# Patient Record
Sex: Male | Born: 1970 | Race: Black or African American | Hispanic: No | Marital: Single | Smoking: Current every day smoker
Health system: Southern US, Community
[De-identification: ages and names within clinical notes are randomized; demographics above are authoritative.]

## PROBLEM LIST (undated history)

## (undated) DIAGNOSIS — K219 Gastro-esophageal reflux disease without esophagitis: Secondary | ICD-10-CM

## (undated) DIAGNOSIS — T7840XA Allergy, unspecified, initial encounter: Secondary | ICD-10-CM

## (undated) DIAGNOSIS — I10 Essential (primary) hypertension: Secondary | ICD-10-CM

## (undated) HISTORY — DX: Allergy, unspecified, initial encounter: T78.40XA

## (undated) HISTORY — PX: WISDOM TOOTH EXTRACTION: SHX21

## (undated) HISTORY — PX: EYE SURGERY: SHX253

---

## 2005-10-22 ENCOUNTER — Ambulatory Visit (HOSPITAL_COMMUNITY): Admission: RE | Admit: 2005-10-22 | Discharge: 2005-10-22 | Payer: Self-pay | Admitting: Chiropractic Medicine

## 2012-09-21 DIAGNOSIS — I10 Essential (primary) hypertension: Secondary | ICD-10-CM | POA: Insufficient documentation

## 2012-10-02 ENCOUNTER — Emergency Department (HOSPITAL_BASED_OUTPATIENT_CLINIC_OR_DEPARTMENT_OTHER): Payer: Self-pay

## 2012-10-02 ENCOUNTER — Encounter (HOSPITAL_BASED_OUTPATIENT_CLINIC_OR_DEPARTMENT_OTHER): Payer: Self-pay | Admitting: Family Medicine

## 2012-10-02 ENCOUNTER — Emergency Department (HOSPITAL_BASED_OUTPATIENT_CLINIC_OR_DEPARTMENT_OTHER)
Admission: EM | Admit: 2012-10-02 | Discharge: 2012-10-02 | Disposition: A | Discharge status: Home / Self Care | Payer: Self-pay | Attending: Emergency Medicine | Admitting: Emergency Medicine

## 2012-10-02 DIAGNOSIS — F17200 Nicotine dependence, unspecified, uncomplicated: Secondary | ICD-10-CM | POA: Insufficient documentation

## 2012-10-02 DIAGNOSIS — R5381 Other malaise: Secondary | ICD-10-CM | POA: Insufficient documentation

## 2012-10-02 DIAGNOSIS — R42 Dizziness and giddiness: Secondary | ICD-10-CM | POA: Insufficient documentation

## 2012-10-02 DIAGNOSIS — Z79899 Other long term (current) drug therapy: Secondary | ICD-10-CM | POA: Insufficient documentation

## 2012-10-02 DIAGNOSIS — R002 Palpitations: Secondary | ICD-10-CM | POA: Insufficient documentation

## 2012-10-02 DIAGNOSIS — I10 Essential (primary) hypertension: Secondary | ICD-10-CM | POA: Insufficient documentation

## 2012-10-02 HISTORY — DX: Essential (primary) hypertension: I10

## 2012-10-02 LAB — CBC WITH DIFFERENTIAL/PLATELET
Basophils Absolute: 0 10*3/uL (ref 0.0–0.1)
Basophils Relative: 0 % (ref 0–1)
Eosinophils Absolute: 0.1 10*3/uL (ref 0.0–0.7)
Eosinophils Relative: 2 % (ref 0–5)
HCT: 42.5 % (ref 39.0–52.0)
Hemoglobin: 14.5 g/dL (ref 13.0–17.0)
Lymphocytes Relative: 16 % (ref 12–46)
Lymphs Abs: 1.1 10*3/uL (ref 0.7–4.0)
MCH: 28.8 pg (ref 26.0–34.0)
MCHC: 34.1 g/dL (ref 30.0–36.0)
MCV: 84.3 fL (ref 78.0–100.0)
Monocytes Absolute: 0.6 10*3/uL (ref 0.1–1.0)
Monocytes Relative: 9 % (ref 3–12)
Neutro Abs: 4.8 10*3/uL (ref 1.7–7.7)
Neutrophils Relative %: 73 % (ref 43–77)
Platelets: 201 10*3/uL (ref 150–400)
RBC: 5.04 MIL/uL (ref 4.22–5.81)
RDW: 13.3 % (ref 11.5–15.5)
WBC: 6.6 10*3/uL (ref 4.0–10.5)

## 2012-10-02 LAB — COMPREHENSIVE METABOLIC PANEL
ALT: 31 U/L (ref 0–53)
AST: 19 U/L (ref 0–37)
Albumin: 4.4 g/dL (ref 3.5–5.2)
Alkaline Phosphatase: 80 U/L (ref 39–117)
BUN: 10 mg/dL (ref 6–23)
CO2: 30 mEq/L (ref 19–32)
Calcium: 10.3 mg/dL (ref 8.4–10.5)
Chloride: 106 mEq/L (ref 96–112)
Creatinine, Ser: 1.1 mg/dL (ref 0.50–1.35)
GFR calc Af Amer: >90 mL/min (ref >90)
GFR calc non Af Amer: 82 mL/min — ABNORMAL LOW (ref >90)
Glucose, Bld: 97 mg/dL (ref 70–99)
Potassium: 4.2 mEq/L (ref 3.5–5.1)
Sodium: 143 mEq/L (ref 135–145)
Total Bilirubin: 0.6 mg/dL (ref 0.3–1.2)
Total Protein: 7.5 g/dL (ref 6.0–8.3)

## 2012-10-02 LAB — TROPONIN I: Troponin I: <0.3 ng/mL (ref <0.30)

## 2012-10-02 NOTE — ED Notes (Signed)
Pt c/o feeling lightheaded intermittently x 3 wks. Pt reports feeling palpitations last night. Pt has h/o htn.

## 2012-10-02 NOTE — ED Provider Notes (Signed)
History     CSN: [id number]  Arrival date & time 10/02/12  1226   First MD Initiated Contact with Patient 10/02/12 1247      Chief Complaint  Patient presents with  . Dizziness    (Consider location/radiation/quality/duration/timing/severity/associated sxs/prior treatment) HPI Comments: Patient presents with episodes of dizziness that occur intermittently over the past several weeks.  He also reports palpitations that occur off and on as well.  Sometimes these coincide, other times the occur separately.  No chest pain or shortness of breath.  Patient is a 42 y.o. male presenting with weakness. The history is provided by the patient.  Weakness This is a new problem. Episode frequency: occasionally. The problem has been gradually worsening. Pertinent negatives include no chest pain and no shortness of breath. Nothing aggravates the symptoms. Nothing relieves the symptoms. He has tried nothing for the symptoms.    Past Medical History  Diagnosis Date  . Hypertension     History reviewed. No pertinent past surgical history.  No family history on file.  History  Substance Use Topics  . Smoking status: Current Every Day Smoker  . Smokeless tobacco: Not on file  . Alcohol Use: No      Review of Systems  Respiratory: Negative for shortness of breath.   Cardiovascular: Negative for chest pain.  Neurological: Positive for weakness.  All other systems reviewed and are negative.    Allergies  Review of patient's allergies indicates no known allergies.  Home Medications   Current Outpatient Rx  Name  Route  Sig  Dispense  Refill  . AMLODIPINE BESYLATE PO   Oral   Take by mouth.         Marland Kitchen aspirin-acetaminophen-caffeine (EXCEDRIN MIGRAINE) 250-250-65 MG per tablet   Oral   Take 1 tablet by mouth every 6 (six) hours as needed for pain.           BP 173/88  Pulse 82  Temp(Src) 99.2 F (37.3 C) (Oral)  Resp 20  Ht 5\' 11"  (1.803 m)  Wt 210 lb (95.255 kg)  BMI  29.3 kg/m2  SpO2 97%  Physical Exam  Nursing note and vitals reviewed. Constitutional: He is oriented to person, place, and time. He appears well-developed and well-nourished. No distress.  HENT:  Head: Normocephalic and atraumatic.  Mouth/Throat: Oropharynx is clear and moist.  Eyes: EOM are normal. Pupils are equal, round, and reactive to light.  Neck: Normal range of motion. Neck supple.  Cardiovascular: Normal rate and regular rhythm.   No murmur heard. Pulmonary/Chest: Effort normal and breath sounds normal. No respiratory distress. He has no wheezes.  Abdominal: Soft. Bowel sounds are normal. He exhibits no distension. There is no tenderness.  Musculoskeletal: Normal range of motion. He exhibits no edema.  Neurological: He is alert and oriented to person, place, and time. No cranial nerve deficit. He exhibits normal muscle tone. Coordination normal.  Skin: Skin is warm and dry. He is not diaphoretic.    ED Course  Procedures (including critical care time)  Labs Reviewed - No data to display No results found.   No diagnosis found.   Date: 10/02/2012  Rate: 69  Rhythm: normal sinus rhythm  QRS Axis: normal  Intervals: normal  ST/T Wave abnormalities: normal  Conduction Disutrbances:none  Narrative Interpretation:   Old EKG Reviewed: none available    MDM  The patient presents with light-headedness, palpitations on and off for the past several weeks.  The labs today are all unremarkable as are  the ekgs and head ct.  He appears appropriate for discharge, to follow up prn.  He is to follow up with [facility] cardiology to discuss event monitoring if symptoms do not resolve.         Geoffery Lyons, MD 10/02/12 1455

## 2012-12-04 ENCOUNTER — Emergency Department (HOSPITAL_BASED_OUTPATIENT_CLINIC_OR_DEPARTMENT_OTHER)
Admission: EM | Admit: 2012-12-04 | Discharge: 2012-12-04 | Disposition: A | Discharge status: Home / Self Care | Payer: Self-pay | Attending: Emergency Medicine | Admitting: Emergency Medicine

## 2012-12-04 ENCOUNTER — Encounter (HOSPITAL_BASED_OUTPATIENT_CLINIC_OR_DEPARTMENT_OTHER): Payer: Self-pay

## 2012-12-04 DIAGNOSIS — F411 Generalized anxiety disorder: Secondary | ICD-10-CM | POA: Insufficient documentation

## 2012-12-04 DIAGNOSIS — H9209 Otalgia, unspecified ear: Secondary | ICD-10-CM | POA: Insufficient documentation

## 2012-12-04 DIAGNOSIS — R0602 Shortness of breath: Secondary | ICD-10-CM | POA: Insufficient documentation

## 2012-12-04 DIAGNOSIS — I10 Essential (primary) hypertension: Secondary | ICD-10-CM | POA: Insufficient documentation

## 2012-12-04 DIAGNOSIS — R519 Headache, unspecified: Secondary | ICD-10-CM

## 2012-12-04 DIAGNOSIS — J309 Allergic rhinitis, unspecified: Secondary | ICD-10-CM | POA: Insufficient documentation

## 2012-12-04 DIAGNOSIS — R002 Palpitations: Secondary | ICD-10-CM | POA: Insufficient documentation

## 2012-12-04 DIAGNOSIS — Z79899 Other long term (current) drug therapy: Secondary | ICD-10-CM | POA: Insufficient documentation

## 2012-12-04 DIAGNOSIS — IMO0002 Reserved for concepts with insufficient information to code with codable children: Secondary | ICD-10-CM | POA: Insufficient documentation

## 2012-12-04 DIAGNOSIS — R51 Headache: Secondary | ICD-10-CM | POA: Insufficient documentation

## 2012-12-04 DIAGNOSIS — F17200 Nicotine dependence, unspecified, uncomplicated: Secondary | ICD-10-CM | POA: Insufficient documentation

## 2012-12-04 MED ORDER — PREDNISONE 50 MG PO TABS
60.0000 mg | ORAL_TABLET | Freq: Once | ORAL | Status: AC
  Administered 2012-12-04: 60 mg via ORAL
  Filled 2012-12-04: qty 1

## 2012-12-04 MED ORDER — FEXOFENADINE HCL 60 MG PO TABS: 60.0000 mg | ORAL_TABLET | Freq: Two times a day (BID) | ORAL | Status: DC

## 2012-12-04 MED ORDER — FLUTICASONE PROPIONATE 50 MCG/ACT NA SUSP: 2.0000 | Freq: Every day | NASAL | Status: DC

## 2012-12-04 NOTE — ED Provider Notes (Signed)
History     This chart was scribed for Rolan Bucco, MD by Jiles Prows, ED Scribe. The patient was seen in room MH05/MH05 and the patient's care was started at 6:06 PM.  CSN: [id number] Arrival date & time 12/04/12  1716  Chief Complaint  Patient presents with  . Dizziness   Patient is a 42 y.o. male presenting with headaches. The history is provided by the patient and medical records. No language interpreter was used.  Headache Pain location:  Frontal and occipital Quality:  Stabbing Radiates to:  Does not radiate Duration:  12 weeks Timing:  Constant Progression:  Waxing and waning Chronicity:  New Relieved by:  Nothing Associated symptoms: dizziness and ear pain   Associated symptoms: no abdominal pain, no back pain, no congestion, no cough, no diarrhea, no fatigue, no fever, no nausea, no numbness and no vomiting    HPI Comments: Scott Watson is a 42 y.o. male with a h/o HTN who presents to the Emergency Department SOB this afternoon, it was associated with palpitations.  It only lasted a few minutes and had no associated CP  He also complains of intermittent, throbbing, moderate frontal and temporal headaches that sometimes move to the occipital area onset 4 months ago.   He states he has taken lots of medications, OTC and prescription, with no relief.  He states that he has had intermittent achy pain in his head since March.  These headaches are sometimes associated with episodes of SOB like the one he described today.  Pt states that today at it's worst, the SOB was associated with a racing heart.  He also claims a ringing in his ears which he described as "static" in his ears.  He describes blowing his nose as painful in his head, but unproductive.  Pt denies chest pain, diaphoresis, fever, chills, vomiting, diarrhea, weakness, cough, and any other pain.  Pt states he has had 2-3 episodes of anxiety feelings since March.  Pt repiorts that [place] he felt cleared up and able to breathe.  Upon return to [facility], he reportedly has had a return of symptoms.  Pt states he took mucinex and Advil sinus and cold with no relief.  He states that sudafed works for him, but he cannot take it due to HTN. He feels that is ongoing headaches since March are related to sinus problems. He feels he has allergies and has tried multiple allergy medicines with no relief. His headaches are unchanged since March. He states that he feels like it's allergies because he when he goes away from the area such as when he recently went to the beach his symptoms go away and his headaches improved when he comes back his nasal congestion and frontal headaches recur.  Pt reports his sinus Dr for these symptoms who prescribed an antibiotic thinking it was a sinus infection, but he felt no relief.  He explains he went to a ENT did a CAT scan and found nothing.  Pt reports that he used to smoke cigarettes often. Past Medical History  Diagnosis Date  . Hypertension    History reviewed. No pertinent past surgical history. No family history on file. History  Substance Use Topics  . Smoking status: Current Every Day Smoker  . Smokeless tobacco: Not on file  . Alcohol Use: No    Review of Systems  Constitutional: Negative for fever, chills, diaphoresis and fatigue.  HENT: Positive for ear pain. Negative  for congestion, rhinorrhea and sneezing.   Eyes: Negative.   Respiratory: Positive for shortness of breath. Negative for cough and chest tightness.   Cardiovascular: Negative for chest pain and leg swelling.  Gastrointestinal: Negative for nausea, vomiting, abdominal pain, diarrhea and blood in stool.  Genitourinary: Negative for frequency, hematuria, flank pain and difficulty urinating.  Musculoskeletal: Negative for back pain and arthralgias.  Skin: Negative for rash.  Neurological: Positive for dizziness and headaches. Negative for speech difficulty, weakness and  numbness.   Allergies  Review of patient's allergies indicates no known allergies.  Home Medications   Current Outpatient Rx  Name  Route  Sig  Dispense  Refill  . AMLODIPINE BESYLATE PO   Oral   Take by mouth.         Marland Kitchen aspirin-acetaminophen-caffeine (EXCEDRIN MIGRAINE) 250-250-65 MG per tablet   Oral   Take 1 tablet by mouth every 6 (six) hours as needed for pain.         . fexofenadine (ALLEGRA) 60 MG tablet   Oral   Take 1 tablet (60 mg total) by mouth 2 (two) times daily.   60 tablet   0   . fluticasone (FLONASE) 50 MCG/ACT nasal spray   Nasal   Place 2 sprays into the nose daily.   16 g   0    BP 143/81  Pulse 88  Temp(Src) 98.7 F (37.1 C)  Resp 18  Ht 5\' 10"  (1.778 m)  Wt 207 lb (93.895 kg)  BMI 29.7 kg/m2  SpO2 100% Physical Exam  Constitutional: He is oriented to person, place, and time. He appears well-developed and well-nourished.  HENT:  Head: Normocephalic and atraumatic.  Eyes: Pupils are equal, round, and reactive to light.  Neck: Normal range of motion. Neck supple.  Cardiovascular: Normal rate, regular rhythm and normal heart sounds.   Pulmonary/Chest: Effort normal and breath sounds normal. No respiratory distress. He has no wheezes. He has no rales. He exhibits no tenderness.  Abdominal: Soft. Bowel sounds are normal. There is no tenderness. There is no rebound and no guarding.  Musculoskeletal: Normal range of motion. He exhibits no edema.  Lymphadenopathy:    He has no cervical adenopathy.  Neurological: He is alert and oriented to person, place, and time.  Skin: Skin is warm and dry. No rash noted.  Psychiatric: He has a normal mood and affect.    ED Course  Procedures (including critical care time) DIAGNOSTIC STUDIES: Oxygen Saturation is 100% on RA, normal by my interpretation.    COORDINATION OF CARE: 6:17 PM - Discussed ED treatment with pt at bedside including EKG, steroid nasal spray and alegra and pt agrees.  Follow up  with PCP suggested if no relief is found.   Date: 12/04/2012  Rate: 71  Rhythm: normal sinus rhythm  QRS Axis: normal  Intervals: normal  ST/T Wave abnormalities: normal  Conduction Disutrbances:none  Narrative Interpretation:   Old EKG Reviewed: unchanged    Labs Reviewed - No data to display No results found. 1. Persistent headaches   2. Allergic sinusitis     MDM  Patient's main complaint today is his frontal headaches and sinus issues. He had one short-lived episode of shortness of breath with no associated chest pain wheezing or fluttering in his heart. He did feel like his heart was beating fast and he feels like that was related to anxiety which is associated with the shortness of breath. His EKG did not show any arrhythmias or ischemic changes.  His headache and sinus issues have been ongoing and are unchanged from his baseline. He is mostly frustrated that he hasn't gotten any relief with prior treatment. He's mostly been using over-the-counter medications. We will go ahead and try Allegra and Flonase which he says he has not tried in the past. I urged him if his headaches continue he may need to see a neurologist. He's had a recent CT scan on a prior visit to the ED for similar symptoms and this is negative. I did not feel that he needed repeat imaging today. He may need MRI if his headaches continue or do not improve with a sinus medications. I advised him to followup with his primary care physician for recheck.     I personally performed the services described in this documentation, which was scribed in my presence.  The recorded information has been reviewed and considered.     Rolan Bucco, MD 12/04/12 1901

## 2012-12-04 NOTE — ED Notes (Addendum)
Intermittent dizziness since March.  States he felt like he was going to pass out today.

## 2013-01-01 ENCOUNTER — Ambulatory Visit: Payer: Self-pay | Admitting: Diagnostic Neuroimaging

## 2013-01-04 DIAGNOSIS — E781 Pure hyperglyceridemia: Secondary | ICD-10-CM | POA: Insufficient documentation

## 2013-02-02 ENCOUNTER — Telehealth: Payer: Self-pay | Admitting: Cardiovascular Disease

## 2013-02-02 ENCOUNTER — Ambulatory Visit (INDEPENDENT_AMBULATORY_CARE_PROVIDER_SITE_OTHER): Payer: Self-pay | Admitting: Cardiovascular Disease

## 2013-02-02 ENCOUNTER — Encounter: Payer: Self-pay | Admitting: Cardiovascular Disease

## 2013-02-02 VITALS — BP 112/80 | HR 72 | Ht 71.0 in | Wt 199.8 lb

## 2013-02-02 DIAGNOSIS — I10 Essential (primary) hypertension: Secondary | ICD-10-CM

## 2013-02-02 NOTE — Assessment & Plan Note (Signed)
Scott Watson presents for further discussion of his HTN.   He has had some minor intolerences but mostly I think he is anxious about his BP readings.    He has done very well since last year. He has stopped smoking. He's lost 20 pounds. He is eating a low salt diet.  He has not yet started to  exercise but I have advised him to start an exercise program.  He wondered about getting lifeline screening. I've examined him very closely and he certainly does not have any evidence of carotid artery disease or  peripheral vascular disease.  He had a normal EKG in June, 2014. There is no evidence of left ventricular hypertrophy. I Understand that he had an x-ray in Dr. Alda Berthold office that mentions mild cardiomegaly but I suspect that he has mild left ventricular hypertrophy due to his hypertension. He certainly has no EKG abdomen allergies to suggest hypertrophic cardiomyopathy.  I reassured him that I think it he's doing quite well. We will see him again in one year for followup visit.

## 2013-02-02 NOTE — Telephone Encounter (Deleted)
ERROR

## 2013-02-02 NOTE — Progress Notes (Signed)
     Scott Watson Date of Birth  25-Apr-1971       [facility]    [place] [address], [address]  [address], [address] [place], [place]  [address]   [place], [place]  [address] [phone]     [phone]   Fax  [phone]    Fax [phone]  Problem List: 1. Hypertension 2. Cardiomegaly  History of Present Illness:  Scott Watson is a 42 yo with hx of HTN.  He is having some issues with his medications.    This past March he was having some problems with some dyspnea.   He has had " buzzing " or "sirens or popping sound"  in his ears . He occasionally has some episodes of dizziness when he stands up.  He has tried amlodipine 5 mg a day and Hyzaar/ HCTZ  50/12.5 but did not tolerate these.   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    He has not been exercising.  He has not problems climbing several flights of stairs.  He avoids salt.  He typically eats broiled fish, grilled chicken.    Current Outpatient Prescriptions on File Prior to Visit  Medication Sig Dispense Refill  . aspirin-acetaminophen-caffeine (EXCEDRIN MIGRAINE) 250-250-65 MG per tablet Take 1 tablet by mouth every 6 (six) hours as needed for pain.       No current facility-administered medications on file prior to visit.  Micardis 80 mg a day  No Known Allergies  Past Medical History  Diagnosis Date  . Hypertension     No past surgical history on file.  History  Smoking status  . Former Smoker  Smokeless tobacco  . Not on file    Comment: Quit March 2014    History  Alcohol Use No    No family history on file.  Reviw of Systems:  Reviewed in the HPI.  All other systems are negative.  Physical Exam: Blood pressure 110/76, pulse 83, height 5\' 11"  (1.803 m), weight 199 lb 12.8 oz (90.629 kg), SpO2 99.00%. General: Well developed, well nourished, in no acute distress.  Head: Normocephalic, atraumatic, sclera non-icteric, mucus membranes  are moist,   Neck: Supple. Carotids are 2 + without bruits. No JVD   Lungs: Clear   Heart: RR, normal S1, S2  Abdomen: Soft, non-tender, non-distended with normal bowel sounds.  Msk:  Strength and tone are normal   Extremities: No clubbing or cyanosis. No edema.  Distal pedal pulses are 2+ and equal    Neuro: CN II - XII intact.  Alert and oriented X 3.   Psych:  Normal   ECG: December 06, 2012:  NSR, normal ECG.  Assessment / Plan:

## 2013-02-02 NOTE — Patient Instructions (Addendum)
Your physician wants you to follow-up in: 1 year  You will receive a reminder letter in the mail two months in advance. If you don't receive a letter, please call our office to schedule the follow-up appointment.  Your physician recommends that you continue on your current medications as directed. Please refer to the Current Medication list given to you today.  

## 2013-02-21 ENCOUNTER — Ambulatory Visit: Payer: Self-pay | Admitting: Cardiovascular Disease

## 2013-04-05 DIAGNOSIS — E663 Overweight: Secondary | ICD-10-CM | POA: Insufficient documentation

## 2013-04-05 DIAGNOSIS — E785 Hyperlipidemia, unspecified: Secondary | ICD-10-CM | POA: Insufficient documentation

## 2013-09-25 NOTE — Telephone Encounter (Signed)
Call was sent to me today requesting note to be closed.  Pt was seen by Dr. Elease HashimotoNahser on day of this call.  Will close phone note.

## 2013-10-01 ENCOUNTER — Emergency Department (HOSPITAL_BASED_OUTPATIENT_CLINIC_OR_DEPARTMENT_OTHER)
Admission: EM | Admit: 2013-10-01 | Discharge: 2013-10-01 | Disposition: A | Discharge status: Home / Self Care | Payer: BC Managed Care – PPO | Attending: Emergency Medicine | Admitting: Emergency Medicine

## 2013-10-01 ENCOUNTER — Encounter (HOSPITAL_BASED_OUTPATIENT_CLINIC_OR_DEPARTMENT_OTHER): Payer: Self-pay | Admitting: Emergency Medicine

## 2013-10-01 ENCOUNTER — Emergency Department (HOSPITAL_BASED_OUTPATIENT_CLINIC_OR_DEPARTMENT_OTHER): Payer: BC Managed Care – PPO

## 2013-10-01 DIAGNOSIS — Z79899 Other long term (current) drug therapy: Secondary | ICD-10-CM | POA: Insufficient documentation

## 2013-10-01 DIAGNOSIS — Z87891 Personal history of nicotine dependence: Secondary | ICD-10-CM | POA: Insufficient documentation

## 2013-10-01 DIAGNOSIS — IMO0002 Reserved for concepts with insufficient information to code with codable children: Secondary | ICD-10-CM | POA: Insufficient documentation

## 2013-10-01 DIAGNOSIS — R0789 Other chest pain: Secondary | ICD-10-CM | POA: Insufficient documentation

## 2013-10-01 DIAGNOSIS — I10 Essential (primary) hypertension: Secondary | ICD-10-CM | POA: Insufficient documentation

## 2013-10-01 LAB — CBC WITH DIFFERENTIAL/PLATELET
Basophils Absolute: 0 10*3/uL (ref 0.0–0.1)
Basophils Relative: 0 % (ref 0–1)
Eosinophils Absolute: 0.1 10*3/uL (ref 0.0–0.7)
Eosinophils Relative: 1 % (ref 0–5)
HCT: 45.2 % (ref 39.0–52.0)
Hemoglobin: 15.3 g/dL (ref 13.0–17.0)
Lymphocytes Relative: 20 % (ref 12–46)
Lymphs Abs: 2.1 10*3/uL (ref 0.7–4.0)
MCH: 29 pg (ref 26.0–34.0)
MCHC: 33.8 g/dL (ref 30.0–36.0)
MCV: 85.6 fL (ref 78.0–100.0)
Monocytes Absolute: 0.8 10*3/uL (ref 0.1–1.0)
Monocytes Relative: 8 % (ref 3–12)
Neutro Abs: 7.3 10*3/uL (ref 1.7–7.7)
Neutrophils Relative %: 71 % (ref 43–77)
Platelets: 254 10*3/uL (ref 150–400)
RBC: 5.28 MIL/uL (ref 4.22–5.81)
RDW: 13.8 % (ref 11.5–15.5)
WBC: 10.3 10*3/uL (ref 4.0–10.5)

## 2013-10-01 LAB — TROPONIN I: Troponin I: <0.3 ng/mL (ref <0.30)

## 2013-10-01 LAB — BASIC METABOLIC PANEL
BUN: 16 mg/dL (ref 6–23)
CO2: 28 mEq/L (ref 19–32)
Calcium: 10 mg/dL (ref 8.4–10.5)
Chloride: 103 mEq/L (ref 96–112)
Creatinine, Ser: 1.1 mg/dL (ref 0.50–1.35)
GFR calc Af Amer: >90 mL/min (ref >90)
GFR calc non Af Amer: 82 mL/min — ABNORMAL LOW (ref >90)
Glucose, Bld: 104 mg/dL — ABNORMAL HIGH (ref 70–99)
Potassium: 4.1 mEq/L (ref 3.7–5.3)
Sodium: 142 mEq/L (ref 137–147)

## 2013-10-01 NOTE — ED Notes (Signed)
Pt returned from xray

## 2013-10-01 NOTE — ED Provider Notes (Signed)
CSN: [id number]     Arrival date & time 10/01/13  1019 History   First MD Initiated Contact with Patient 10/01/13 1024     Chief Complaint  Patient presents with  . Chest Pain     (Consider location/radiation/quality/duration/timing/severity/associated sxs/prior Treatment) Patient is a 43 y.o. male presenting with chest pain.  Chest Pain  Pt with history of HTN reports several months of intermittent sharp and/or aching pains 'in my heart'. Points to L chest area. No associated SOB, diaphoresis or nausea. Pain happens after eating sometimes or when he rolls over in bed. Denies any prior history of CAD. Has not seen PCP for same. He was at the store today to get some Zantac and a lady there told him to come to the ER.   Past Medical History  Diagnosis Date  . Hypertension    History reviewed. No pertinent past surgical history. History reviewed. No pertinent family history. History  Substance Use Topics  . Smoking status: Former Games developermoker  . Smokeless tobacco: Not on file     Comment: Quit March 2014  . Alcohol Use: No    Review of Systems  Cardiovascular: Positive for chest pain.   All other systems reviewed and are negative except as noted in HPI.     Allergies  Review of patient's allergies indicates no known allergies.  Home Medications   Prior to Admission medications   Medication Sig Start Date End Date Taking? Authorizing Provider  aspirin-acetaminophen-caffeine (EXCEDRIN MIGRAINE) [id number] MG per tablet Take 1 tablet by mouth every 6 (six) hours as needed for pain.    Historical Provider, MD  fexofenadine (ALLEGRA) 60 MG tablet Take 60 mg by mouth as needed. 12/04/12   Rolan BuccoMelanie Belfi, MD  fluticasone (FLONASE) 50 MCG/ACT nasal spray Place 2 sprays into the nose as needed. 12/04/12   Rolan BuccoMelanie Belfi, MD  telmisartan (MICARDIS) 80 MG tablet Take 80 mg by mouth daily.    Historical Provider, MD   BP 131/71  Pulse 63  Resp 22  SpO2 99% Physical Exam  Nursing note and  vitals reviewed. Constitutional: He is oriented to person, place, and time. He appears well-developed and well-nourished.  HENT:  Head: Normocephalic and atraumatic.  Eyes: EOM are normal. Pupils are equal, round, and reactive to light.  Neck: Normal range of motion. Neck supple.  Cardiovascular: Normal rate, normal heart sounds and intact distal pulses.   Pulmonary/Chest: Effort normal and breath sounds normal. He has no wheezes. He has no rales. He exhibits no tenderness.  Abdominal: Bowel sounds are normal. He exhibits no distension. There is no tenderness.  Musculoskeletal: Normal range of motion. He exhibits no edema and no tenderness.  Neurological: He is alert and oriented to person, place, and time. He has normal strength. No cranial nerve deficit or sensory deficit.  Skin: Skin is warm and dry. No rash noted.  Psychiatric: He has a normal mood and affect.    ED Course  Procedures (including critical care time) Labs Review Labs Reviewed  BASIC METABOLIC PANEL - Abnormal; Notable for the following:    Glucose, Bld 104 (*)    GFR calc non Af Amer 82 (*)    All other components within normal limits  CBC WITH DIFFERENTIAL  TROPONIN I    Imaging Review Dg Chest 2 View  10/01/2013   CLINICAL DATA:  Chest pain, throbbing, history hypertension  EXAM: CHEST  2 VIEW  COMPARISON:  None  FINDINGS: Upper normal heart size.  Normal mediastinal contours  and pulmonary vascularity.  Lungs clear.  No pleural effusion or pneumothorax.  Bones unremarkable.  IMPRESSION: No acute abnormalities.   Electronically Signed   By: Ulyses SouthwardMark  Boles M.D.   On: 10/01/2013 11:02     EKG Interpretation   Date/Time:  Monday October 01 2013 10:25:41 EDT Ventricular Rate:  66 PR Interval:  170 QRS Duration: 88 QT Interval:  372 QTC Calculation: 389 R Axis:   61 Text Interpretation:  Normal sinus rhythm Normal ECG No significant change  since last tracing Confirmed by Janis Sol  MD, Leonette MostHARLES [phone]([id number]) on 10/01/2013   10:28:57 AM      MDM   Final diagnoses:  Atypical chest pain    Labs and imaging neg. Doubt ACS given atypical symptoms and months long duration. Advised to followup with PCP for further evaluation. Continue OTC meds for GERD.    Nyjah Schwake B. Bernette MayersSheldon, MD 10/01/13 1131

## 2013-10-01 NOTE — ED Notes (Signed)
MD at bedside. 

## 2013-10-01 NOTE — Discharge Instructions (Signed)
Chest Pain (Nonspecific) °It is often hard to give a specific diagnosis for the cause of chest pain. There is always a chance that your pain could be related to something serious, such as a heart attack or a blood clot in the lungs. You need to follow up with your caregiver for further evaluation. °CAUSES  °· Heartburn. °· Pneumonia or bronchitis. °· Anxiety or stress. °· Inflammation around your heart (pericarditis) or lung (pleuritis or pleurisy). °· A blood clot in the lung. °· A collapsed lung (pneumothorax). It can develop suddenly on its own (spontaneous pneumothorax) or from injury (trauma) to the chest. °· Shingles infection (herpes zoster virus). °The chest wall is composed of bones, muscles, and cartilage. Any of these can be the source of the pain. °· The bones can be bruised by injury. °· The muscles or cartilage can be strained by coughing or overwork. °· The cartilage can be affected by inflammation and become sore (costochondritis). °DIAGNOSIS  °Lab tests or other studies, such as X-rays, electrocardiography, stress testing, or cardiac imaging, may be needed to find the cause of your pain.  °TREATMENT  °· Treatment depends on what may be causing your chest pain. Treatment may include: °· Acid blockers for heartburn. °· Anti-inflammatory medicine. °· Pain medicine for inflammatory conditions. °· Antibiotics if an infection is present. °· You may be advised to change lifestyle habits. This includes stopping smoking and avoiding alcohol, caffeine, and chocolate. °· You may be advised to keep your head raised (elevated) when sleeping. This reduces the chance of acid going backward from your stomach into your esophagus. °· Most of the time, nonspecific chest pain will improve within 2 to 3 days with rest and mild pain medicine. °HOME CARE INSTRUCTIONS  °· If antibiotics were prescribed, take your antibiotics as directed. Finish them even if you start to feel better. °· For the next few days, avoid physical  activities that bring on chest pain. Continue physical activities as directed. °· Do not smoke. °· Avoid drinking alcohol. °· Only take over-the-counter or prescription medicine for pain, discomfort, or fever as directed by your caregiver. °· Follow your caregiver's suggestions for further testing if your chest pain does not go away. °· Keep any follow-up appointments you made. If you do not go to an appointment, you could develop lasting (chronic) problems with pain. If there is any problem keeping an appointment, you must call to reschedule. °SEEK MEDICAL CARE IF:  °· You think you are having problems from the medicine you are taking. Read your medicine instructions carefully. °· Your chest pain does not go away, even after treatment. °· You develop a rash with blisters on your chest. °SEEK IMMEDIATE MEDICAL CARE IF:  °· You have increased chest pain or pain that spreads to your arm, neck, jaw, back, or abdomen. °· You develop shortness of breath, an increasing cough, or you are coughing up blood. °· You have severe back or abdominal pain, feel nauseous, or vomit. °· You develop severe weakness, fainting, or chills. °· You have a fever. °THIS IS AN EMERGENCY. Do not wait to see if the pain will go away. Get medical help at once. Call your local emergency services (911 in [place] Do not drive yourself to the hospital. °MAKE SURE YOU:  °· Understand these instructions. °· Will watch your condition. °· Will get help right away if you are not doing well or get worse. °Document Released: 03/10/2005 Document Revised: 08/23/2011 Document Reviewed: 01/04/2008 °ExitCare® Patient Information ©2014 [employer]   [employer]. ° °

## 2013-10-01 NOTE — ED Notes (Signed)
Patient transported to X-ray 

## 2013-10-01 NOTE — ED Notes (Signed)
Pt reports CP x 'months'.  Reports that that his 'heart is achy'.  Denies N/V/SOB.

## 2013-10-11 ENCOUNTER — Encounter: Payer: Self-pay | Admitting: *Deleted

## 2013-10-15 ENCOUNTER — Encounter: Payer: Self-pay | Admitting: Physician Assistant

## 2013-10-15 ENCOUNTER — Ambulatory Visit (INDEPENDENT_AMBULATORY_CARE_PROVIDER_SITE_OTHER): Payer: BC Managed Care – PPO | Admitting: Physician Assistant

## 2013-10-15 VITALS — BP 118/62 | HR 62 | Ht 71.0 in | Wt 195.4 lb

## 2013-10-15 DIAGNOSIS — R079 Chest pain, unspecified: Secondary | ICD-10-CM

## 2013-10-15 DIAGNOSIS — I10 Essential (primary) hypertension: Secondary | ICD-10-CM

## 2013-10-15 NOTE — Patient Instructions (Signed)
Your physician has requested that you have an exercise tolerance test. For further information please visit [email or web address]. Please also follow instruction sheet, as given.  Your physician recommends that you continue on your current medications as directed. Please refer to the Current Medication list given to you today.  

## 2013-10-15 NOTE — Progress Notes (Signed)
  [address] [place], [place]  [address] Phone: [phone] Fax:  [phone]  Date:  10/15/2013   ID:  Scott PumaRoderick O Pantaleo, DOB 1971-01-17, MRN [id number]  PCP:  Herb GraysSPEAR, TAMMY, MD  Cardiologist:  Dr. Delane GingerPhil Nahser     History of Present Illness: Scott Watson is a 43 y.o. male with a hx of HTN.  Last seen by Dr. Delane GingerPhil Nahser in 01/2013.  He was seen in the ED recently with complaints of chest pain.  He notes chest pain off and on for over a year.  It was much worse when he went to the ED.  CEs were neg.  CXR was ok.  He denies assoc symptoms.  No significant dyspnea.  No orthopnea, PND, edema.  No syncope. His PCP has given him H2RA to try for possible GERD.  Recent Labs: 10/01/2013: Creatinine 1.10; Hemoglobin 15.3; Potassium 4.1   Wt Readings from Last 3 Encounters:  10/15/13 195 lb 6.4 oz (88.633 kg)  02/02/13 199 lb 12.8 oz (90.629 kg)  12/04/12 207 lb (93.895 kg)     Past Medical History  Diagnosis Date  . Hypertension     Current Outpatient Prescriptions  Medication Sig Dispense Refill  . aspirin-acetaminophen-caffeine (EXCEDRIN MIGRAINE) 250-250-65 MG per tablet Take 1 tablet by mouth every 6 (six) hours as needed for pain.      . fexofenadine (ALLEGRA) 60 MG tablet Take 60 mg by mouth as needed.      . fluticasone (FLONASE) 50 MCG/ACT nasal spray Place 2 sprays into the nose as needed.      Marland Kitchen. telmisartan (MICARDIS) 80 MG tablet Take 80 mg by mouth daily.       No current facility-administered medications for this visit.    Allergies:   Review of patient's allergies indicates no known allergies.   Social History:  The patient  reports that he has been smoking.  He does not have any smokeless tobacco history on file. He reports that he does not drink alcohol or use illicit drugs.   Family History:  The patient's family history is not on file.   ROS:  Please see the history of present illness.      All other systems reviewed and negative.   PHYSICAL  EXAM: VS:  BP 118/62  Pulse 62  Ht 5\' 11"  (1.803 m)  Wt 195 lb 6.4 oz (88.633 kg)  BMI 27.26 kg/m2 Well nourished, well developed, in no acute distress HEENT: normal Neck: no JVD Cardiac:  normal S1, S2; RRR; no murmur Lungs:  clear to auscultation bilaterally, no wheezing, rhonchi or rales Abd: soft, nontender, no hepatomegaly Ext: no edema Skin: warm and dry Neuro:  CNs 2-12 intact, no focal abnormalities noted  EKG:  NSR, HR 60, normal axis, NSSTTW changes     ASSESSMENT AND PLAN:  1. Chest pain:  Somewhat atypical.  He does have cardiac risk factors to include smoking hx, +/- FHx, HTN, age > 40.  Will arrange plain ETT to r/o ischemia.  Otherwise, continue H2RA and f/u with PCP. 2. HTN (hypertension):  Controlled. 3. Disposition:  F/u with Dr. Delane GingerPhil Nahser as planned or return sooner if ETT abnormal.  Signed, Tereso NewcomerScott Christia Domke, PA-C  10/15/2013 3:36 PM

## 2013-11-20 ENCOUNTER — Ambulatory Visit (INDEPENDENT_AMBULATORY_CARE_PROVIDER_SITE_OTHER): Payer: BC Managed Care – PPO | Admitting: Physician Assistant

## 2013-11-20 DIAGNOSIS — I10 Essential (primary) hypertension: Secondary | ICD-10-CM

## 2013-11-20 DIAGNOSIS — R079 Chest pain, unspecified: Secondary | ICD-10-CM

## 2013-11-20 NOTE — Progress Notes (Signed)
Exercise Treadmill Test  Pre-Exercise Testing Evaluation Rhythm: normal sinus  Rate: 71 bpm     Test  Exercise Tolerance Test Ordering MD: Kristeen Miss, MD  Interpreting MD: Tereso Newcomer, PA-C  Unique Test No: 1  Treadmill:  1  Indication for ETT: chest pain - rule out ischemia  Contraindication to ETT: No   Stress Modality: exercise - treadmill  Cardiac Imaging Performed: non   Protocol: standard Bruce - maximal  Max BP:  177/75  Max MPHR (bpm):  179 85% MPR (bpm):  152  MPHR obtained (bpm):  164 % MPHR obtained:  91  Reached 85% MPHR (min:sec):  9:12 Total Exercise Time (min-sec):  10:00  Workload in METS:  11.7 Borg Scale: 15  Reason ETT Terminated:  desired heart rate attained    ST Segment Analysis At Rest: normal ST segments - no evidence of significant ST depression With Exercise: non-specific ST changes  Other Information Arrhythmia:  No Angina during ETT:  absent (0) Quality of ETT:  diagnostic  ETT Interpretation:  normal - no evidence of ischemia by ST analysis  Comments: Good exercise capacity. No chest pain. Normal BP response to exercise. Non-specific ST-T changes.  No ST depression to suggest ischemia.   Recommendations: F/u with Dr. Delane Ginger as planned. Signed,  Tereso Newcomer, PA-C   11/20/2013 9:38 AM

## 2014-03-12 ENCOUNTER — Ambulatory Visit (INDEPENDENT_AMBULATORY_CARE_PROVIDER_SITE_OTHER): Payer: BC Managed Care – PPO | Admitting: Cardiovascular Disease

## 2014-03-12 ENCOUNTER — Encounter: Payer: Self-pay | Admitting: Cardiovascular Disease

## 2014-03-12 VITALS — BP 116/90 | HR 73 | Ht 71.0 in | Wt 197.2 lb

## 2014-03-12 DIAGNOSIS — I10 Essential (primary) hypertension: Secondary | ICD-10-CM

## 2014-03-12 NOTE — Patient Instructions (Signed)
Your physician recommends that you continue on your current medications as directed. Please refer to the Current Medication list given to you today.  Your physician wants you to follow-up in: 1 year with Dr. Nahser.  You will receive a reminder letter in the mail two months in advance. If you don't receive a letter, please call our office to schedule the follow-up appointment.  

## 2014-03-12 NOTE — Assessment & Plan Note (Signed)
Scott Watson is doing well.  BP is well controlled. He wants to follow up with me in a year. He had some CP this past May - work up was negative.  GXT was normal  No further evaluation needed at this time

## 2014-03-12 NOTE — Progress Notes (Signed)
     Scott Watson Date of Birth  09-29-1970       [facility]    [place] [facility] [address]  [address] [place], [place]  [address]   [place], [place]  [address] [phone]     [phone]   Fax  [phone]    Fax [phone]  Problem List: 1. Hypertension 2. Cardiomegaly  History of Present Illness:  Scott Watson is a 43 yo with hx of HTN.  He is having some issues with his medications.    This past March he was having some problems with some dyspnea.   He has had " buzzing " or "sirens or popping sound"  in his ears . He occasionally has some episodes of dizziness when he stands up.  He has tried amlodipine 5 mg a day and Hyzaar/ HCTZ  50/12.5 but did not tolerate these.  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   He has not been exercising.  He has not problems climbing several flights of stairs.  He avoids salt.  He typically eats broiled fish, grilled chicken.    Sept. 29, 2015:  Scott Watson was seenin May, 2015  in the ER.    He had a normal stress test by Tereso NewcomerScott Weaver -  Doing well with his diet.  Has had blood work with Herb Graysammy Spear, MD  Current Outpatient Prescriptions on File Prior to Visit  Medication Sig Dispense Refill  . telmisartan (MICARDIS) 80 MG tablet Take 40 mg by mouth daily.        No current facility-administered medications on file prior to visit.  Micardis 80 mg a day  No Known Allergies  Past Medical History  Diagnosis Date  . Hypertension     No past surgical history on file.  History  Smoking status  . Current Every Day Smoker  Smokeless tobacco  . Not on file    Comment: Quit March 2014    History  Alcohol Use No    No family history on file.  Reviw of Systems:  Reviewed in the HPI.  All other systems are negative.  Physical Exam: Blood pressure 116/90, pulse 73, height 5\' 11"  (1.803 m), weight 197 lb 3.2 oz (89.449 kg). General: Well developed, well  nourished, in no acute distress.  Head: Normocephalic, atraumatic, sclera non-icteric, mucus membranes are moist,   Neck: Supple. Carotids are 2 + without bruits. No JVD   Lungs: Clear   Heart: RR, normal S1, S2  Abdomen: Soft, non-tender, non-distended with normal bowel sounds.  Msk:  Strength and tone are normal   Extremities: No clubbing or cyanosis. No edema.  Distal pedal pulses are 2+ and equal    Neuro: CN II - XII intact.  Alert and oriented X 3.   Psych:  Normal   ECG: December 06, 2012:  NSR, normal ECG.  Assessment / Plan:

## 2014-06-08 ENCOUNTER — Emergency Department (HOSPITAL_BASED_OUTPATIENT_CLINIC_OR_DEPARTMENT_OTHER): Payer: BC Managed Care – PPO

## 2014-06-08 ENCOUNTER — Encounter (HOSPITAL_BASED_OUTPATIENT_CLINIC_OR_DEPARTMENT_OTHER): Payer: Self-pay | Admitting: Emergency Medicine

## 2014-06-08 ENCOUNTER — Emergency Department (HOSPITAL_BASED_OUTPATIENT_CLINIC_OR_DEPARTMENT_OTHER)
Admission: EM | Admit: 2014-06-08 | Discharge: 2014-06-08 | Disposition: A | Discharge status: Home / Self Care | Payer: BC Managed Care – PPO | Attending: Emergency Medicine | Admitting: Emergency Medicine

## 2014-06-08 DIAGNOSIS — M25511 Pain in right shoulder: Secondary | ICD-10-CM | POA: Diagnosis present

## 2014-06-08 DIAGNOSIS — R079 Chest pain, unspecified: Secondary | ICD-10-CM

## 2014-06-08 DIAGNOSIS — I10 Essential (primary) hypertension: Secondary | ICD-10-CM | POA: Insufficient documentation

## 2014-06-08 DIAGNOSIS — Z87828 Personal history of other (healed) physical injury and trauma: Secondary | ICD-10-CM | POA: Insufficient documentation

## 2014-06-08 DIAGNOSIS — Z720 Tobacco use: Secondary | ICD-10-CM | POA: Diagnosis not present

## 2014-06-08 DIAGNOSIS — Z79899 Other long term (current) drug therapy: Secondary | ICD-10-CM | POA: Diagnosis not present

## 2014-06-08 DIAGNOSIS — W19XXXA Unspecified fall, initial encounter: Secondary | ICD-10-CM

## 2014-06-08 DIAGNOSIS — R52 Pain, unspecified: Secondary | ICD-10-CM

## 2014-06-08 NOTE — ED Notes (Signed)
MD at bedside. 

## 2014-06-08 NOTE — ED Notes (Signed)
Pt presents to ED with complaints of right shoulder pain. Pt states he fell down a flight of steps a couple months ago and rt shoulder has been hurting since. Pt states he has been to his PCP but is not satisfied and wants to be seen by ED doctor.

## 2014-06-08 NOTE — ED Provider Notes (Signed)
CSN: [id number]     Arrival date & time 06/08/14  1053 History   First MD Initiated Contact with Patient 06/08/14 1154     Chief Complaint  Patient presents with  . Shoulder Pain     Patient is a 43 y.o. male presenting with shoulder pain. The history is provided by the patient.  Shoulder Pain Location:  Shoulder Time since incident: several months. Shoulder location:  R shoulder Pain details:    Quality:  Aching   Severity:  Mild   Onset quality:  Gradual   Timing:  Intermittent   Progression:  Unchanged Chronicity:  Recurrent Relieved by:  Nothing Worsened by:  Movement Associated symptoms: no fever   patient presents for multiple complaints : he reports right shoulder pain for several months after falling down stairs.  He reports no xray taken at that time, but since then he will have intermittent pain that comes at random, sometimes worse after sleeping  He also reports random/brief episodes of chest pain that starts in his right shoulder and radiates throughout his chest.  He denies diaphoresis/weakness/sob.  He has no CP at this time.  He also reports heartburn after eating for "awhile" that he has spoken to his PCP about in the past.  No h/o CAD   Past Medical History  Diagnosis Date  . Hypertension    History reviewed. No pertinent past surgical history. No family history on file. History  Substance Use Topics  . Smoking status: Current Every Day Smoker  . Smokeless tobacco: Not on file     Comment: Quit March 2014  . Alcohol Use: No    Review of Systems  Constitutional: Negative for fever.  Respiratory: Negative for shortness of breath.   Gastrointestinal: Negative for vomiting.  Musculoskeletal: Positive for arthralgias.  Neurological: Negative for weakness.  All other systems reviewed and are negative.     Allergies  Review of patient's allergies indicates no known allergies.  Home Medications   Prior to Admission medications   Medication Sig  Start Date End Date Taking? Authorizing Provider  telmisartan (MICARDIS) 80 MG tablet Take 40 mg by mouth daily.     Historical Provider, MD   BP 143/84 mmHg  Pulse 68  Temp(Src) 98.6 F (37 C) (Oral)  Resp 18  Ht 5\' 11"  (1.803 m)  Wt 199 lb (90.266 kg)  BMI 27.77 kg/m2  SpO2 100% Physical Exam CONSTITUTIONAL: Well developed/well nourished HEAD: Normocephalic/atraumatic EYES: EOMI/PERRL ENMT: Mucous membranes moist NECK: supple no meningeal signs SPINE/BACK:entire spine nontender CV: S1/S2 noted, no murmurs/rubs/gallops noted LUNGS: Lungs are clear to auscultation bilaterally, no apparent distress ABDOMEN: soft, nontender, no rebound or guarding, bowel sounds noted throughout abdomen GU:no cva tenderness NEURO: Pt is awake/alert/appropriate, moves all extremitiesx4.  No facial droop.   EXTREMITIES: pulses normal/equal, full ROM, mild tenderness to palpation of right shoulder but no bruising/crepitus noted.  He can fully abduct/adduct the shoulder without difficulty SKIN: warm, color normal PSYCH: no abnormalities of mood noted, alert and oriented to situation  ED Course  Procedures   Pt resting comfortably Imaging negative He has no CP at this time Defer further workup for now Advised need for f/u with sports medicine for continued right shoulder pain   Imaging Review Dg Chest 2 View  06/08/2014   CLINICAL DATA:  Pain, fell down stairs 2 months ago, right shoulder injury  EXAM: CHEST  2 VIEW  COMPARISON:  10/01/2013  FINDINGS: Cardiomediastinal silhouette is stable. No acute infiltrate or pleural  effusion. No pulmonary edema. Bony thorax is unremarkable.  IMPRESSION: No active cardiopulmonary disease.   Electronically Signed   By: Natasha MeadLiviu  Pop M.D.   On: 06/08/2014 12:55   Dg Shoulder Right  06/08/2014   CLINICAL DATA:  Fall 2 months ago.  Right shoulder pain.  EXAM: RIGHT SHOULDER - 2+ VIEW  COMPARISON:  None.  FINDINGS: Small calcific density in the peripheral rotator  cuff tendon. Otherwise, no fracture or dislocation.  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 Tiny avulsion fracture is not excluded.   Electronically Signed   By: Maryclare BeanArt  Hoss M.D.   On: 06/08/2014 13:01     EKG Interpretation   Date/Time:  Saturday June 08 2014 12:09:09 EST Ventricular Rate:  67 PR Interval:  196 QRS Duration: 92 QT Interval:  376 QTC Calculation: 397 R Axis:   77 Text Interpretation:  Normal sinus rhythm Normal ECG No significant change  since last tracing Confirmed by Bebe ShaggyWICKLINE  MD, Dorinda HillNALD ([id number]) on 06/08/2014  12:22:25 PM      MDM   Final diagnoses:  Pain  Chest pain, unspecified chest pain type  Right shoulder pain    Nursing notes including past medical history and social history reviewed and considered in documentation xrays/imaging reviewed by myself and considered during evaluation     Joya Gaskinsonald W Marzelle Rutten, MD 06/08/14 1557

## 2014-06-08 NOTE — Discharge Instructions (Signed)

## 2014-06-17 ENCOUNTER — Ambulatory Visit: Payer: BC Managed Care – PPO | Admitting: Family Medicine

## 2014-06-20 ENCOUNTER — Ambulatory Visit: Payer: BLUE CROSS/BLUE SHIELD | Admitting: Family Medicine

## 2014-08-20 ENCOUNTER — Encounter: Payer: Self-pay | Admitting: Family Medicine

## 2014-08-20 ENCOUNTER — Encounter (INDEPENDENT_AMBULATORY_CARE_PROVIDER_SITE_OTHER): Payer: Self-pay

## 2014-08-20 ENCOUNTER — Ambulatory Visit (INDEPENDENT_AMBULATORY_CARE_PROVIDER_SITE_OTHER): Payer: 59 | Admitting: Family Medicine

## 2014-08-20 VITALS — BP 128/81 | HR 64 | Ht 70.0 in | Wt 201.0 lb

## 2014-08-20 DIAGNOSIS — M25512 Pain in left shoulder: Secondary | ICD-10-CM

## 2014-08-20 MED ORDER — METHYLPREDNISOLONE ACETATE 40 MG/ML IJ SUSP
40.0000 mg | Freq: Once | INTRAMUSCULAR | Status: AC
  Administered 2014-08-20: 40 mg via INTRA_ARTICULAR

## 2014-08-20 NOTE — Patient Instructions (Signed)
You have rotator cuff impingement Try to avoid painful activities (overhead activities, lifting with extended arm) as much as possible. Aleve 2 tabs twice a day with food OR ibuprofen 3 tabs three times a day with food for pain and inflammation. Can take tylenol in addition to this. Subacromial injection may be beneficial to help with pain and to decrease inflammation - you were given this today. Start physical therapy with transition to home exercise program. Do home exercise program with theraband and scapular stabilization exercises daily - these are very important for long term relief even if an injection was given. If not improving at follow-up we will consider further imaging and/or nitro patches. Follow up with me in 5-6 weeks.

## 2014-08-22 DIAGNOSIS — M25512 Pain in left shoulder: Secondary | ICD-10-CM | POA: Insufficient documentation

## 2014-08-22 NOTE — Progress Notes (Addendum)
PCP: Althea Charoneborah Elizabeth Cobb FNP  Subjective:   HPI: Patient is a 44 y.o. male here for left shoulder pain.  Patient reports he fell about 6 months ago down some steps. He tried to brace himself and injured the left shoulder. No prior injuries. Motion has been more limited because of pain since then. Massage has helped. No night pain. Radiographs 12/26 showed a small calcific density peripheral rotator cuff but this was of the right shoulder - seen with complaints of this shoulder hurting in ED that day. Pain radiates down arm at times. No numbness or tingling. Worse moving overhead.  Past Medical History  Diagnosis Date  . Hypertension     Current Outpatient Prescriptions on File Prior to Visit  Medication Sig Dispense Refill  . telmisartan (MICARDIS) 80 MG tablet Take 40 mg by mouth daily.      No current facility-administered medications on file prior to visit.    No past surgical history on file.  No Known Allergies  History   Social History  . Marital Status: Single    Spouse Name: N/A  . Number of Children: N/A  . Years of Education: N/A   Occupational History  . Not on file.   Social History Main Topics  . Smoking status: Current Every Day Smoker  . Smokeless tobacco: Not on file     Comment: Quit March 2014  . Alcohol Use: No  . Drug Use: No  . Sexual Activity: Not on file   Other Topics Concern  . Not on file   Social History Narrative    No family history on file.  BP 128/81 mmHg  Pulse 64  Ht 5\' 10"  (1.778 m)  Wt 201 lb (91.173 kg)  BMI 28.84 kg/m2  Review of Systems: See HPI above.    Objective:  Physical Exam:  Gen: NAD  Left shoulder: No swelling, ecchymoses.  No gross deformity. No TTP. FROM with painful arc. Positive Hawkins, Neers. Negative Speeds, Yergasons. Strength 5/5 with empty can and resisted internal/external rotation.  Pain empty can. Negative apprehension. NV intact distally.    Assessment & Plan:  1. Left  shoulder pain - consistent with rotator cuff impingement.  Excellent strength.  History is confusing though as in ED he complained of right shoulder pain and had this imaged.  Now with left shoulder pain - may be coincidental.  Given subacromial injection today and will start physical therapy.  Icing, nsaids as needed.  F/u in 5-6 weeks.  After informed written consent, patient was seated on exam table. Left shoulder was prepped with alcohol swab and utilizing posterior approach, patient's left subacromial space was injected with 3:1 marcaine: depomedrol. Patient tolerated the procedure well without immediate complications.

## 2014-08-22 NOTE — Assessment & Plan Note (Signed)
consistent with rotator cuff impingement.  Excellent strength.  History is confusing though as in ED he complained of right shoulder pain and had this imaged.  Now with left shoulder pain - may be coincidental.  Given subacromial injection today and will start physical therapy.  Icing, nsaids as needed.  F/u in 5-6 weeks.  After informed written consent, patient was seated on exam table. Left shoulder was prepped with alcohol swab and utilizing posterior approach, patient's left subacromial space was injected with 3:1 marcaine: depomedrol. Patient tolerated the procedure well without immediate complications.

## 2014-08-29 ENCOUNTER — Encounter: Payer: Self-pay | Admitting: Rehabilitation

## 2014-08-29 ENCOUNTER — Ambulatory Visit: Payer: 59 | Attending: Family Medicine | Admitting: Rehabilitation

## 2014-08-29 DIAGNOSIS — M25512 Pain in left shoulder: Secondary | ICD-10-CM | POA: Insufficient documentation

## 2014-08-29 NOTE — Therapy (Signed)
[facility] [place] [address]  [address] [place], [place], [address] Phone: [phone]   Fax:  [phone]  Physical Therapy Evaluation  Patient Details  Name: Scott Watson MRN: [id number] Date of Birth: 1971-02-13 Referring Provider:  Lenda KelpHudnall, Shane R, MD  Encounter Date: 08/29/2014      PT End of Session - 08/29/14 0941    Visit Number 1   Number of Visits 8   Date for PT Re-Evaluation 09/26/14   PT Start Time 0850   PT Stop Time 0938   PT Time Calculation (min) 48 min      Past Medical History  Diagnosis Date  . Hypertension     History reviewed. No pertinent past surgical history.  There were no vitals filed for this visit.  Visit Diagnosis:  Pain in joint, shoulder region, left - Plan: PT plan of care cert/re-cert      Subjective Assessment - 08/29/14 0853    Symptoms Pt presents with L shoulder pain c/o after falling in November down a few stairs.  xrays normal. started to get worse in the shoulder with sleeping positions, reaching activities. Larey SeatFell again on the stairs at his apartment in January  not sure if this made it worse or not since it was already hurting.  Got an injection a week ago and it helped for a few days. Intermittent pain into the scapular region and down the arm when tight.  Not working right now.    Pertinent History none   Currently in Pain? Yes   Pain Score 3   up to almost 10/10 with certain movements   Pain Location Shoulder   Pain Orientation Left   Pain Descriptors / Indicators Aching;Sharp   Pain Onset More than a month ago   Pain Frequency Constant   Aggravating Factors  driving.turning the wheel,  reaching overhead, gym activities,    Pain Relieving Factors rest   Effect of Pain on Daily Activities can do everything but it is just painful            [facility] PT Assessment - 08/29/14 0001    Assessment   Medical Diagnosis L impingement   Next MD Visit not scheduled   Prior  Therapy no   Precautions   Precautions None   Restrictions   Weight Bearing Restrictions No   Balance Screen   Has the patient fallen in the past 6 months Yes   Prior Function   Level of Independence Independent with basic ADLs   Observation/Other Assessments   Focus on Therapeutic Outcomes (FOTO)  50% limitation   Posture/Postural Control   Posture Comments L scapula protraction and depression   ROM / Strength   AROM / PROM / Strength AROM;PROM;Strength   AROM   AROM Assessment Site Shoulder   Right/Left Shoulder Right;Left   Right Shoulder Flexion 170 Degrees   Right Shoulder ABduction 170 Degrees   Right Shoulder Internal Rotation --  T10    Right Shoulder External Rotation --  behind head full   Left Shoulder Flexion 150 Degrees  pain   Left Shoulder ABduction 125 Degrees  pain   Left Shoulder Internal Rotation --  T10 with pain   Left Shoulder External Rotation --  behind head full with pain   PROM   Overall PROM Comments pain with all motions EOR only guarding on all   Strength   Strength Assessment Site Shoulder   Right/Left Shoulder Right;Left   Right Shoulder Flexion  4/5   Right Shoulder ABduction 4+/5   Right Shoulder External Rotation 4+/5   Left Shoulder Flexion 4/5   Left Shoulder Extension --  pain   Left Shoulder ABduction 4/5  pain   Left Shoulder Internal Rotation 4/5   Left Shoulder External Rotation 4+/5  pain   Palpation   Palpation +1 ttp anterior shoulder; pain and guarding with AP and inf glides graded 2/6 compared to 3/6 R   Special Tests    Special Tests --  less pain and pinch with postural correction and MWM      Eval performed Performance and education on HEP:  See instructions Kinesiotaping:  L shoulder impingement;  Deltoid Y strip, anterior shoulder I strips into retraction and ER with 50% tension                    PT Education - 08/29/14 0941    Education provided Yes   Person(s) Educated Patient   Methods  Explanation;Demonstration;Handout   Comprehension Verbalized understanding;Returned demonstration          PT Short Term Goals - 08/29/14 0945    PT SHORT TERM GOAL #1   Title indepedent with intial HEP   Time 1   Period Weeks   Status Achieved           PT Long Term Goals - 08/29/14 0945    PT LONG TERM GOAL #1   Title independent with final HEP  4/14   Time 4   Period Weeks   Status New   PT LONG TERM GOAL #2   Title improve shoulder AROM to painfree 4/14   Time 4   Period Weeks   Status New   PT LONG TERM GOAL #3   Title decrease L shoulder pain to intermittent only  4/14   Time 4   Period Weeks   Status New   PT LONG TERM GOAL #4   Title return to painfree driving 4/78   Time 4   Period Weeks   Status New               Plan - 08/29/14 2956    Clinical Impression Statement Pt presents with symptoms consistent with L shoulder impingement after a fall in November and again in January.  Pt demonstrates painful AROM and MMT all planes and painful passive EOR motions with guarding and decreased accessory mobility at the shoulder. Improvements made in status with just postural correction and taping.    Pt will benefit from skilled therapeutic intervention in order to improve on the following deficits Decreased range of motion;Decreased strength;Pain;Postural dysfunction   Rehab Potential Excellent   PT Frequency 2x / week   PT Duration 4 weeks   PT Treatment/Interventions Cryotherapy;Electrical Stimulation;Ultrasound;Therapeutic exercise;Manual techniques;Other (comment)  iontophoresis with dexamethasone   PT Next Visit Plan taping, postural TE, manual ROM/joint mob, RC isometrics into isontonics below 90deg         Problem List Patient Active Problem List   Diagnosis Date Noted  . Left shoulder pain 08/22/2014  . HTN (hypertension) 02/02/2013    Idamae Lusher, DPT, CMP 08/29/2014, 9:48 AM  [facility] [address]  [address] [place], [place], [address] Phone: [phone]   Fax:  [phone]

## 2014-09-02 ENCOUNTER — Ambulatory Visit: Payer: 59 | Admitting: Rehabilitation

## 2014-09-02 DIAGNOSIS — M25512 Pain in left shoulder: Secondary | ICD-10-CM | POA: Diagnosis not present

## 2014-09-02 NOTE — Therapy (Signed)
[facility] [address]  [address] [place], [place], [address] Phone: [phone]   Fax:  [phone]  Physical Therapy Treatment  Patient Details  Name: Scott Watson MRN: [id number] Date of Birth: 01-May-1971 Referring Provider:  Lenda KelpHudnall, Shane R, MD  Encounter Date: 09/02/2014      PT End of Session - 09/02/14 0847    Visit Number 2   Number of Visits 8   Date for PT Re-Evaluation 09/26/14   PT Start Time 0847   PT Stop Time 0925   PT Time Calculation (min) 38 min   Activity Tolerance Patient tolerated treatment well   Behavior During Therapy [facility] for tasks assessed/performed      Past Medical History  Diagnosis Date  . Hypertension     No past surgical history on file.  There were no vitals filed for this visit.  Visit Diagnosis:  Pain in joint, shoulder region, left      Subjective Assessment - 09/02/14 0848    Symptoms Reports compliance with HEP and that the tape seemed to help a little bit. Thinks pain is going down a little.    Currently in Pain? Yes   Pain Score --  3-4/10   Pain Location Shoulder   Pain Orientation Left     Today's Treatment: TherEx- Seated pulleys flexion, horizontal abduction, abduction x10 each Standing rows/extensions green TB x10 each  Supine circles at 90 degrees flexion 2# x15 cw/ccw Supine horizontal abd/add (small motion) 2# x15 then flexion/extension (small motion) 2# x15 Supine bilateral pullover 3# 3 second hold x10  IR/ER isometrics at 45 degrees abduction 3 second holds x10 each GH distraction with patient reduction 3 second x10   Manual- GH A/P and Inferior glides at 45, 90 degrees abduction, flexion. Then A/P with IR and ER movements. Gentle distraction with oscillations for pain. PROM all directions to patient tolerance.    Kinesiotaping: L shoulder impingement; Deltoid Y strip, 2 anterior shoulder I strips into retraction and ER with 50%  tension         PT Short Term Goals - 08/29/14 0945    PT SHORT TERM GOAL #1   Title indepedent with intial HEP   Time 1   Period Weeks   Status Achieved           PT Long Term Goals - 08/29/14 0945    PT LONG TERM GOAL #1   Title independent with final HEP  4/14   Time 4   Period Weeks   Status New   PT LONG TERM GOAL #2   Title improve shoulder AROM to painfree 4/14   Time 4   Period Weeks   Status New   PT LONG TERM GOAL #3   Title decrease L shoulder pain to intermittent only  4/14   Time 4   Period Weeks   Status New   PT LONG TERM GOAL #4   Title return to painfree driving 3/244/14   Time 4   Period Weeks   Status New         Problem List Patient Active Problem List   Diagnosis Date Noted  . Left shoulder pain 08/22/2014  . HTN (hypertension) 02/02/2013    Ronney LionDUCKER, Jameika Kinn J, PTA 09/02/2014, 9:26 AM  [facility] [place] [address]  [address] [place], [place], [address] Phone: [phone]   Fax:  [phone]

## 2014-09-04 ENCOUNTER — Ambulatory Visit: Payer: 59 | Admitting: Rehabilitation

## 2014-09-04 DIAGNOSIS — M25512 Pain in left shoulder: Secondary | ICD-10-CM

## 2014-09-04 NOTE — Therapy (Signed)
[facility] [address]  [address] [place], [place], [address] Phone: [phone]   Fax:  [phone]  Physical Therapy Treatment  Patient Details  Name: Scott Watson MRN: [id number] Date of Birth: 1971/01/08 Referring Provider:  Lenda Kelp, MD  Encounter Date: 09/04/2014      PT End of Session - 09/04/14 0847    Visit Number 3   Number of Visits 8   Date for PT Re-Evaluation 09/26/14   PT Start Time 0847   PT Stop Time 0925   PT Time Calculation (min) 38 min      Past Medical History  Diagnosis Date  . Hypertension     No past surgical history on file.  There were no vitals filed for this visit.  Visit Diagnosis:  Pain in joint, shoulder region, left      Subjective Assessment - 09/04/14 0849    Symptoms Still thinks pain is going down a little but feeling about the same. Notes pain with certain movements and when he was trying to go to sleep last night. Says pain varies from time to time and depending on the movement.    Currently in Pain? Yes   Pain Score --  2.5/10   Pain Location Shoulder   Pain Orientation Left       Today's Treatment: TherEx- UBE level 1.5 2 min fwd/2 min bwd Doorway stretch 3x20 seconds Standing rows/extensions green TB x15 each  Standing small ball on wall circles at 90 degrees flexion cw/ccw x10 each then at 90 degrees abduction x10 each Supine rhythmic stab at 90 degrees flexion 2x45 seconds (pain noted only with pressure into horizontal adduction) IR/ER isometrics at 45 degrees abduction 3 seconds holds x10 each Rt side-lying Lt open book/horizontal abduction 3 seconds x10 Supine bilateral pullover 3# 3 second hold x10  Manual- GH Inferior glides grade II-III at 45, 90 degrees abduction, flexion. Then A/P mobs grade II-III with IR and ER movements  Rt side-lying Lt scapular mobs to decrease guarding  PROM all directions to patient tolerance              PT Short Term Goals - 08/29/14 0945    PT SHORT TERM GOAL #1   Title indepedent with intial HEP   Time 1   Period Weeks   Status Achieved           PT Long Term Goals - 08/29/14 0945    PT LONG TERM GOAL #1   Title independent with final HEP  4/14   Time 4   Period Weeks   Status New   PT LONG TERM GOAL #2   Title improve shoulder AROM to painfree 4/14   Time 4   Period Weeks   Status New   PT LONG TERM GOAL #3   Title decrease L shoulder pain to intermittent only  4/14   Time 4   Period Weeks   Status New   PT LONG TERM GOAL #4   Title return to painfree driving 6/96   Time 4   Period Weeks   Status New               Plan - 09/04/14 2952    Clinical Impression Statement Pain/tightness noted with doorway stretch, did not reapply tape due to it still being secured. Good tolerance to new exericses without increased pain.    PT Next Visit Plan Continue    Consulted and Agree with Plan  of Care Patient        Problem List Patient Active Problem List   Diagnosis Date Noted  . Left shoulder pain 08/22/2014  . HTN (hypertension) 02/02/2013    Ronney LionDUCKER, Chauntel Windsor J, PTA 09/04/2014, 9:27 AM  [facility] [place] [address]  [address] [place], [place], [address] Phone: [phone]   Fax:  [phone]

## 2014-09-09 ENCOUNTER — Encounter: Payer: 59 | Admitting: Rehabilitation

## 2014-09-11 ENCOUNTER — Encounter: Payer: 59 | Admitting: Rehabilitation

## 2014-09-13 ENCOUNTER — Ambulatory Visit: Payer: 59 | Admitting: Rehabilitation

## 2014-09-16 ENCOUNTER — Encounter: Payer: 59 | Admitting: Rehabilitation

## 2014-09-17 ENCOUNTER — Encounter: Payer: Self-pay | Admitting: Rehabilitation

## 2014-09-17 ENCOUNTER — Ambulatory Visit: Payer: 59 | Attending: Family Medicine | Admitting: Rehabilitation

## 2014-09-17 DIAGNOSIS — M25512 Pain in left shoulder: Secondary | ICD-10-CM | POA: Insufficient documentation

## 2014-09-17 NOTE — Therapy (Signed)
[facility] [address]  [address] [place], [place], [address] Phone: [phone]   Fax:  [phone]  Physical Therapy Treatment  Patient Details  Name: Scott Watson MRN: [id number] Date of Birth: 1971/02/12 Referring Provider:  Lenda Kelp, MD  Encounter Date: 09/17/2014      PT End of Session - 09/17/14 0855    Visit Number 4   Number of Visits 8   Date for PT Re-Evaluation 09/26/14   PT Start Time 0845   PT Stop Time 0926   PT Time Calculation (min) 41 min   Activity Tolerance Patient tolerated treatment well      Past Medical History  Diagnosis Date  . Hypertension     History reviewed. No pertinent past surgical history.  There were no vitals filed for this visit.  Visit Diagnosis:  Pain in joint, shoulder region, left      Subjective Assessment - 09/17/14 0845    Subjective The shoulder feels about the same.  Reports no changes in intensity of movement. Reports attempting to focus on postural changes and avoiding painful movements.    Currently in Pain? No/denies   Pain Score --  pain up to 3/10 with things like driving   Aggravating Factors  driving, reaching overhead, gym activities, sleeping   Pain Relieving Factors rest            [facility] PT Assessment - 09/17/14 0001    AROM   Left Shoulder Flexion 157 Degrees  pain   Left Shoulder ABduction 150 Degrees  pain   Left Shoulder Internal Rotation --  T8 with pain   Left Shoulder External Rotation --  full and painfree       Today's Treatment: TherEx- ROM UBE level 2  2 min fwd/2 min bwd bil Doorway stretch held today due to too much pain posteriorly Low row 25# 3x12 Green band ER/IR x 20 each with demo and cueing R shoulder extension green x 20  Corner lift outs x 20 with mirror to keep R shoulder depressed Wall 45 deg presses 5" x 10 with sig cueing and demo for completion Reports fatigue and tightness after  exercises   Manual- GH Inferior and AP glides grade IV at 45 degrees abduction, flexion. PROM all directions to patient tolerance Kinesiotape Y deltoid and retraction I strip                        PT Short Term Goals - 08/29/14 0945    PT SHORT TERM GOAL #1   Title indepedent with intial HEP   Time 1   Period Weeks   Status Achieved           PT Long Term Goals - 08/29/14 0945    PT LONG TERM GOAL #1   Title independent with final HEP  4/14   Time 4   Period Weeks   Status New   PT LONG TERM GOAL #2   Title improve shoulder AROM to painfree 4/14   Time 4   Period Weeks   Status New   PT LONG TERM GOAL #3   Title decrease L shoulder pain to intermittent only  4/14   Time 4   Period Weeks   Status New   PT LONG TERM GOAL #4   Title return to painfree driving 1/32   Time 4   Period Weeks   Status New  Plan - 09/17/14 0856    Clinical Impression Statement Reports no change subjectively but does have improved ROM today with less pain   PT Next Visit Plan Continue; update HEP to include tband if tolerated well after this session        Problem List Patient Active Problem List   Diagnosis Date Noted  . Left shoulder pain 08/22/2014  . HTN (hypertension) 02/02/2013    Idamae Lusherevis, Georgeanna Radziewicz R, DPT, CMP 09/17/2014, 9:27 AM  [facility] [place] [address]  [address] [place], [place], [address] Phone: [phone]   Fax:  [phone]

## 2014-09-18 ENCOUNTER — Encounter: Payer: 59 | Admitting: Rehabilitation

## 2014-09-19 ENCOUNTER — Ambulatory Visit: Payer: 59 | Admitting: Rehabilitation

## 2014-09-19 DIAGNOSIS — M25512 Pain in left shoulder: Secondary | ICD-10-CM

## 2014-09-19 NOTE — Therapy (Signed)
[facility] [place] [address]  [address] [place], [place], [address] Phone: [phone]   Fax:  [phone]  Physical Therapy Treatment  Patient Details  Name: Scott Watson MRN: [id number] Date of Birth: 12/27/1970 Referring Provider:  Lenda KelpHudnall, Shane R, MD  Encounter Date: 09/19/2014      PT End of Session - 09/19/14 0851    Visit Number 5   Number of Visits 8   Date for PT Re-Evaluation 09/26/14   PT Start Time 0847   PT Stop Time 0922   PT Time Calculation (min) 35 min      Past Medical History  Diagnosis Date  . Hypertension     No past surgical history on file.  There were no vitals filed for this visit.  Visit Diagnosis:  Pain in joint, shoulder region, left      Subjective Assessment - 09/19/14 0850    Subjective States shoulder might be a little better but still notes increased pain with certain movements. Felt fine after last time.    Currently in Pain? Yes   Pain Score --  2-4/10   Pain Location Shoulder   Pain Orientation Left   Pain Descriptors / Indicators Aching          Today's Treatment: TherEx- UBE level 2 2 min fwd/2 min bwd bil Doorway stretch (low) 2x20" Green band ER/IR x 20 each with demo and cueing L shoulder extension green x 20  Low row 25# 2x15  Narrow pulldown 25# x10 with pause for flexion stretch  Manual- GH Inferior and AP glides grade IV at 45 degrees abduction, flexion. PROM all directions to patient tolerance STM/Crossfriction to Pec area/ant shoulder          PT Short Term Goals - 08/29/14 0945    PT SHORT TERM GOAL #1   Title indepedent with intial HEP   Time 1   Period Weeks   Status Achieved           PT Long Term Goals - 08/29/14 0945    PT LONG TERM GOAL #1   Title independent with final HEP  4/14   Time 4   Period Weeks   Status New   PT LONG TERM GOAL #2   Title improve shoulder AROM to painfree 4/14   Time 4   Period Weeks   Status New   PT LONG TERM GOAL #3   Title decrease L shoulder pain to intermittent only  4/14   Time 4   Period Weeks   Status New   PT LONG TERM GOAL #4   Title return to painfree driving 4/404/14   Time 4   Period Weeks   Status New               Plan - 09/19/14 [id number]    Clinical Impression Statement Noted shoulder fatigue after exericses but no increased pain. Still very tender pec/ant shoudler. PROM WNL today.    PT Next Visit Plan Continue   Consulted and Agree with Plan of Care Patient        Problem List Patient Active Problem List   Diagnosis Date Noted  . Left shoulder pain 08/22/2014  . HTN (hypertension) 02/02/2013    Ronney LionDUCKER, Dagmawi Venable J, PTA 09/19/2014, 9:23 AM  [facility] [place] [address]  [address] [place], [place], [address] Phone: [phone]   Fax:  [phone]

## 2014-09-23 ENCOUNTER — Ambulatory Visit: Payer: 59 | Admitting: Physical Therapy

## 2014-09-23 ENCOUNTER — Encounter: Payer: 59 | Admitting: Rehabilitation

## 2014-09-24 ENCOUNTER — Ambulatory Visit: Payer: 59 | Admitting: Physical Therapy

## 2014-09-24 NOTE — Addendum Note (Signed)
Addended by: Lenda KelpHUDNALL, Kuuipo Anzaldo R on: 09/24/2014 04:45 PM   Modules accepted: Kipp BroodSmartSet

## 2014-09-25 ENCOUNTER — Encounter: Payer: 59 | Admitting: Rehabilitation

## 2014-10-04 ENCOUNTER — Ambulatory Visit: Payer: 59 | Admitting: Physical Therapy

## 2014-10-04 DIAGNOSIS — M25512 Pain in left shoulder: Secondary | ICD-10-CM

## 2014-10-04 NOTE — Therapy (Signed)
[facility] [place] [address]  [place], [place], [address] Phone: [phone]   Fax:  [phone]  Physical Therapy Treatment  Patient Details  Name: Donnie Panik MRN: [id number] Date of Birth: 1970/08/28 Referring Provider:  Dene Gentry, MD  Encounter Date: 10/04/2014      PT End of Session - 10/04/14 0911    Visit Number 6   Number of Visits 8   Date for PT Re-Evaluation 09/26/14   PT Start Time 0846   PT Stop Time 0911   PT Time Calculation (min) 25 min   Activity Tolerance Patient tolerated treatment well   Behavior During Therapy [facility] for tasks assessed/performed      Past Medical History  Diagnosis Date  . Hypertension     No past surgical history on file.  There were no vitals filed for this visit.  Visit Diagnosis:  Pain in joint, shoulder region, left      Subjective Assessment - 10/04/14 0847    Subjective L shoulder feels better   Pain Score 1    Pain Location Shoulder   Pain Orientation Left   Pain Descriptors / Indicators Aching   Pain Onset More than a month ago   Pain Frequency Constant   Aggravating Factors  gradually increases throughout day   Pain Relieving Factors rest            [facility] PT Assessment - 10/04/14 0910    Observation/Other Assessments   Focus on Therapeutic Outcomes (FOTO)  85 (15% limited)   AROM   Overall AROM Comments L shoulder WNL without pain                     [facility] Adult PT Treatment/Exercise - 10/04/14 0853    Shoulder Exercises: Standing   External Rotation Left;15 reps;Theraband   Theraband Level (Shoulder External Rotation) Level 3 (Green)   Internal Rotation Left;15 reps;Theraband   Theraband Level (Shoulder Internal Rotation) Level 3 (Green)   Flexion Left;15 reps;Theraband   Theraband Level (Shoulder Flexion) Level 3 (Green)   Extension Left;15 reps;Theraband   Theraband Level (Shoulder Extension) Level 3 (Green)   Shoulder Exercises: ROM/Strengthening   UBE (Upper Arm Bike) Level 2.5 x 8 min; 32min forward/4 min backward   Other ROM/Strengthening Exercises rows both hand holds 3x10 35#   Other ROM/Strengthening Exercises lat pulldown 35# 3x10                PT Education - 10/04/14 0910    Education provided Yes   Education Details return to gym; importance of compliance with HEP   Person(s) Educated Patient   Methods Explanation   Comprehension Verbalized understanding          PT Short Term Goals - 10/04/14 0911    PT SHORT TERM GOAL #1   Title indepedent with intial HEP   Status Achieved           PT Long Term Goals - 10/04/14 0854    PT LONG TERM GOAL #1   Title independent with final HEP  4/14   Status Achieved   PT LONG TERM GOAL #2   Title improve shoulder AROM to painfree 4/14   PT LONG TERM GOAL #3   Title decrease L shoulder pain to intermittent only  4/14   Status Achieved   PT LONG TERM GOAL #4   Title return to painfree driving 9/21   Status Achieved  Plan - 10/04/14 0911    Clinical Impression Statement All goals met; will d/c PT   PT Next Visit Plan d/c PT   Consulted and Agree with Plan of Care Patient        Problem List Patient Active Problem List   Diagnosis Date Noted  . Left shoulder pain 08/22/2014  . HTN (hypertension) 02/02/2013   Laureen Abrahams, PT, DPT 10/04/2014 9:16 AM  [facility] [address]  [place], [place], [address] Phone: [phone]   Fax:  [phone]     PHYSICAL THERAPY DISCHARGE SUMMARY  Visits from Start of Care: 6  Current functional level related to goals / functional outcomes: See above all goals met   Remaining deficits: n/a   Education / Equipment: HEP, return to community fitness, reinforced importance of compliance with continued exercise as pt reports minimal compliance with HEP.  Plan: Patient agrees  to discharge.  Patient goals were met. Patient is being discharged due to meeting the stated rehab goals.  ?????    Laureen Abrahams, PT, DPT 10/04/2014 9:17 AM  [facility] [place], [place] [address]  [phone] (office) [phone] (fax)

## 2015-03-13 ENCOUNTER — Ambulatory Visit (INDEPENDENT_AMBULATORY_CARE_PROVIDER_SITE_OTHER): Payer: 59 | Admitting: Cardiovascular Disease

## 2015-03-13 ENCOUNTER — Encounter: Payer: Self-pay | Admitting: Cardiovascular Disease

## 2015-03-13 VITALS — BP 115/72 | HR 65 | Ht 70.0 in | Wt 196.0 lb

## 2015-03-13 DIAGNOSIS — I10 Essential (primary) hypertension: Secondary | ICD-10-CM

## 2015-03-13 NOTE — Progress Notes (Signed)
Scott Watson Date of Birth  Mar 30, 1971       [facility]    [place] [address]  [address], [address] [place], [place]  [address]   [place], [place]  [address] [phone]     [phone]   Fax  [phone]    Fax [phone]  Problem List: 1. Hypertension 2. Cardiomegaly  History of Present Illness:  Scott Watson is a 44 yo with hx of HTN.  He is having some issues with his medications.    This past March he was having some problems with some dyspnea.   He has had " buzzing " or "sirens or popping sound"  in his ears . He occasionally has some episodes of dizziness when he stands up.  He has tried amlodipine 5 mg a day and Hyzaar/ HCTZ  50/12.5 but did not tolerate these.  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   He has not been exercising.  He has not problems climbing several flights of stairs.  He avoids salt.  He typically eats broiled fish, grilled chicken.    Sept. 29, 2015:  Scott Watson was seenin May, 2015  in the ER.    He had a normal stress test by Tereso Newcomer -  Doing well with his diet.  Has had blood work with Herb Grays, MD  Sept. 29,  2016:  Doing well. Wants to go to truck driving school  . Dr. Collins Scotland has retired and he will need to find another doctor .   No CP or dyspnea.  Has all of his blood work drawn every 6 months by his primary medical doctor.   Current Outpatient Prescriptions on File Prior to Visit  Medication Sig Dispense Refill  . telmisartan (MICARDIS) 80 MG tablet Take 40 mg by mouth daily.      No current facility-administered medications on file prior to visit.  Micardis 80 mg a day  No Known Allergies  Past Medical History  Diagnosis Date  . Hypertension     History reviewed. No pertinent past surgical history.  History  Smoking status  . Current Every Day Smoker  Smokeless tobacco  . Not on file    Comment: Quit March 2014    History  Alcohol Use  No    Family History  Problem Relation Age of Onset  . Diabetes Mother   . Hypertension Mother   . Heart disease Father   . Hypertension Father   . Stroke Father     Reviw of Systems:  Reviewed in the HPI.  All other systems are negative.  Physical Exam: Blood pressure 115/72, pulse 65, height  (1.778 m), weight 88.905 kg (196 lb). General: Well developed, well nourished, in no acute distress.  Head: Normocephalic, atraumatic, sclera non-icteric, mucus membranes are moist,   Neck: Supple. Carotids are 2 + without bruits. No JVD   Lungs: Clear   Heart: RR, normal S1, S2  Abdomen: Soft, non-tender, non-distended with normal bowel sounds.  Msk:  Strength and tone are normal   Extremities: No clubbing or cyanosis. No edema.  Distal pedal pulses are 2+ and equal    Neuro: CN II - XII intact.  Alert and oriented X 3.   Psych:  Normal   ECG: Sept. 29, 2016:  NSR at 65.  Sinus arrhythmia  Assessment / Plan:   1. Hypertension - BP has been well controlled.   continue current medications.  He will continue to follow-up with his general medical doctor. i will see him in 1 year.   2. Cardiomegaly - related to HTN.  No signs of symptoms of CHF   Lucindy Borel J, MD  03/13/2015 11:03 AM    [facility] [address] [place],  [address] [place], [place]  [address] Pager [phone] Phone: [phone]; Fax: [phone]   [facility]  [address] [place], [place]  [address] [phone]   Fax [phone]

## 2015-03-13 NOTE — Patient Instructions (Signed)
Medication Instructions:  Your physician recommends that you continue on your current medications as directed. Please refer to the Current Medication list given to you today.   Labwork: None Ordered   Testing/Procedures: None Ordered   Follow-Up: Your physician wants you to follow-up in: 1 year with Dr. Nahser.  You will receive a reminder letter in the mail two months in advance. If you don't receive a letter, please call our office to schedule the follow-up appointment.      

## 2015-05-27 ENCOUNTER — Emergency Department (HOSPITAL_BASED_OUTPATIENT_CLINIC_OR_DEPARTMENT_OTHER)
Admission: EM | Admit: 2015-05-27 | Discharge: 2015-05-27 | Discharge status: Against Medical Advice | Payer: 59 | Attending: Emergency Medicine | Admitting: Emergency Medicine

## 2015-05-27 ENCOUNTER — Emergency Department (HOSPITAL_BASED_OUTPATIENT_CLINIC_OR_DEPARTMENT_OTHER): Payer: 59

## 2015-05-27 ENCOUNTER — Encounter (HOSPITAL_BASED_OUTPATIENT_CLINIC_OR_DEPARTMENT_OTHER): Payer: Self-pay

## 2015-05-27 DIAGNOSIS — R0602 Shortness of breath: Secondary | ICD-10-CM | POA: Insufficient documentation

## 2015-05-27 DIAGNOSIS — R112 Nausea with vomiting, unspecified: Secondary | ICD-10-CM | POA: Diagnosis not present

## 2015-05-27 DIAGNOSIS — Z8719 Personal history of other diseases of the digestive system: Secondary | ICD-10-CM | POA: Insufficient documentation

## 2015-05-27 DIAGNOSIS — F17200 Nicotine dependence, unspecified, uncomplicated: Secondary | ICD-10-CM | POA: Insufficient documentation

## 2015-05-27 DIAGNOSIS — Z79899 Other long term (current) drug therapy: Secondary | ICD-10-CM | POA: Diagnosis not present

## 2015-05-27 DIAGNOSIS — I10 Essential (primary) hypertension: Secondary | ICD-10-CM | POA: Insufficient documentation

## 2015-05-27 DIAGNOSIS — R079 Chest pain, unspecified: Secondary | ICD-10-CM | POA: Insufficient documentation

## 2015-05-27 HISTORY — DX: Gastro-esophageal reflux disease without esophagitis: K21.9

## 2015-05-27 LAB — COMPREHENSIVE METABOLIC PANEL
ALT: 19 U/L (ref 17–63)
AST: 18 U/L (ref 15–41)
Albumin: 4.2 g/dL (ref 3.5–5.0)
Alkaline Phosphatase: 62 U/L (ref 38–126)
Anion gap: 4 — ABNORMAL LOW (ref 5–15)
BUN: 13 mg/dL (ref 6–20)
CO2: 29 mmol/L (ref 22–32)
Calcium: 9.1 mg/dL (ref 8.9–10.3)
Chloride: 109 mmol/L (ref 101–111)
Creatinine, Ser: 0.97 mg/dL (ref 0.61–1.24)
GFR calc Af Amer: >60 mL/min (ref >60)
GFR calc non Af Amer: >60 mL/min (ref >60)
Glucose, Bld: 123 mg/dL — ABNORMAL HIGH (ref 65–99)
Potassium: 3.7 mmol/L (ref 3.5–5.1)
Sodium: 142 mmol/L (ref 135–145)
Total Bilirubin: 0.7 mg/dL (ref 0.3–1.2)
Total Protein: 7 g/dL (ref 6.5–8.1)

## 2015-05-27 LAB — CBC WITH DIFFERENTIAL/PLATELET
Basophils Absolute: 0 10*3/uL (ref 0.0–0.1)
Basophils Relative: 0 %
Eosinophils Absolute: 0.1 10*3/uL (ref 0.0–0.7)
Eosinophils Relative: 1 %
HCT: 40.2 % (ref 39.0–52.0)
Hemoglobin: 13.2 g/dL (ref 13.0–17.0)
Lymphocytes Relative: 13 %
Lymphs Abs: 1.1 10*3/uL (ref 0.7–4.0)
MCH: 28.5 pg (ref 26.0–34.0)
MCHC: 32.8 g/dL (ref 30.0–36.0)
MCV: 86.8 fL (ref 78.0–100.0)
Monocytes Absolute: 0.6 10*3/uL (ref 0.1–1.0)
Monocytes Relative: 7 %
Neutro Abs: 6.6 10*3/uL (ref 1.7–7.7)
Neutrophils Relative %: 79 %
Platelets: 190 10*3/uL (ref 150–400)
RBC: 4.63 MIL/uL (ref 4.22–5.81)
RDW: 13.5 % (ref 11.5–15.5)
WBC: 8.4 10*3/uL (ref 4.0–10.5)

## 2015-05-27 LAB — TROPONIN I: Troponin I: <0.03 ng/mL (ref <0.031)

## 2015-05-27 MED ORDER — GI COCKTAIL ~~LOC~~
30.0000 mL | Freq: Once | ORAL | Status: AC
  Administered 2015-05-27: 30 mL via ORAL
  Filled 2015-05-27: qty 30

## 2015-05-27 MED ORDER — ONDANSETRON HCL 4 MG/2ML IJ SOLN
4.0000 mg | Freq: Once | INTRAMUSCULAR | Status: AC
  Administered 2015-05-27: 4 mg via INTRAVENOUS
  Filled 2015-05-27: qty 2

## 2015-05-27 NOTE — ED Provider Notes (Signed)
CSN: [id number]     Arrival date & time 05/27/15  2012 History  By signing my name below, I, Scott Watson, attest that this documentation has been prepared under the direction and in the presence of No att. providers found. Electronically Signed: Jarvis Watson, ED Scribe. 05/29/2015. 3:37 AM.     Chief Complaint  Patient presents with  . Chest Pain   The history is provided by the patient. No language interpreter was used.    HPI Comments: Scott Watson is a 44 y.o. male with a h/o GERD and HTN who presents to the Emergency Department complaining of sudden onset, constant, mild, 3/10, burning, non exertional, non radiating, chest pain while he was eating dinner tonight around 1 hour ago. Pt states he has a h/o GERD but this feels different from his normal GERD symptoms. He reports associated mild SOB, mild nausea, and emesis x3. Pt notes that drinking water made it worse. He denies any alleviating factors. Pt has not recently been taking his GERD medication. He denies any prior h/o heart issues. Pt notes that his mother and father have a h/o heart disease but denies any early onset heart problems. He is a current everyday smoker. Pt denies any recent surgeries. He denies any h/o blood clots. He denies any diaphoresis, abdominal pain, leg swelling, or leg pain.    Past Medical History  Diagnosis Date  . Hypertension   . GERD (gastroesophageal reflux disease)    History reviewed. No pertinent past surgical history. Family History  Problem Relation Age of Onset  . Diabetes Mother   . Hypertension Mother   . Heart disease Father   . Hypertension Father   . Stroke Father    Social History  Substance Use Topics  . Smoking status: Current Every Day Smoker  . Smokeless tobacco: None     Comment: Quit March 2014  . Alcohol Use: No    Review of Systems  Constitutional: Negative for fever and diaphoresis.  HENT: Negative for sore throat.   Eyes: Negative for visual disturbance.   Respiratory: Positive for shortness of breath.   Cardiovascular: Positive for chest pain. Negative for leg swelling.  Gastrointestinal: Positive for nausea and vomiting. Negative for abdominal pain.  Genitourinary: Negative for difficulty urinating.  Musculoskeletal: Negative for myalgias, back pain, arthralgias and neck stiffness.  Skin: Negative for rash.  Neurological: Negative for syncope and headaches.      Allergies  Review of patient's allergies indicates no known allergies.  Home Medications   Prior to Admission medications   Medication Sig Start Date End Date Taking? Authorizing Provider  telmisartan (MICARDIS) 80 MG tablet Take 40 mg by mouth daily.     Historical Provider, MD   Triage Vitals: BP 146/96 mmHg  Pulse 97  Temp(Src) 98.3 F (36.8 C) (Oral)  Resp 18  Ht  (1.778 m)  Wt 194 lb (87.998 kg)  BMI 27.84 kg/m2  SpO2 100%  Physical Exam  Constitutional: He is oriented to person, place, and time. He appears well-developed and well-nourished. No distress.  HENT:  Head: Normocephalic and atraumatic.  Eyes: Conjunctivae and EOM are normal.  Neck: Normal range of motion. Neck supple. No tracheal deviation present.  Cardiovascular: Normal rate, regular rhythm, normal heart sounds and intact distal pulses.  Exam reveals no gallop and no friction rub.   No murmur heard. Pulmonary/Chest: Effort normal and breath sounds normal. No respiratory distress. He has no wheezes. He has no rales.  Abdominal: Soft. He exhibits  no distension. There is no tenderness. There is no guarding.  Musculoskeletal: Normal range of motion. He exhibits no edema.  Neurological: He is alert and oriented to person, place, and time.  Skin: Skin is warm and dry. He is not diaphoretic.  Psychiatric: He has a normal mood and affect. His behavior is normal.  Nursing note and vitals reviewed.   ED Course  Procedures (including critical care time)  DIAGNOSTIC STUDIES: Oxygen Saturation  is 100% on RA, normal by my interpretation.    COORDINATION OF CARE: 9:20 PM- Will order CXR, CMP, Troponin I, CBC w/diff, 12 lead EKG and Zofran.  Pt advised of plan for treatment and pt agrees.  3:37 AM- Pt left AMA   Labs Review Labs Reviewed  COMPREHENSIVE METABOLIC PANEL - Abnormal; Notable for the following:    Glucose, Bld 123 (*)    Anion gap 4 (*)    All other components within normal limits  TROPONIN I  CBC WITH DIFFERENTIAL/PLATELET    Imaging Review Dg Chest 2 View  05/27/2015  CLINICAL DATA:  RIGHT lower chest pain after eating ribs 90 minutes ago, history smoking, GERD EXAM: CHEST  2 VIEW COMPARISON:  06/08/2014 FINDINGS: Borderline enlargement of cardiac silhouette. Mediastinal contours and pulmonary vascularity normal. Lungs clear. No pleural effusion or pneumothorax. Bones unremarkable. IMPRESSION: Borderline enlargement of cardiac silhouette. No acute abnormalities. Electronically Signed   By: Ulyses SouthwardMark  Boles M.D.   On: 05/27/2015 21:41   I have personally reviewed and evaluated these images and lab results as part of my medical decision-making.   EKG Interpretation   Date/Time:  Tuesday May 27 2015 20:23:53 EST Ventricular Rate:  84 PR Interval:  170 QRS Duration: 92 QT Interval:  352 QTC Calculation: 415 R Axis:   69 Text Interpretation:  Normal sinus rhythm Normal ECG ED PHYSICIAN  INTERPRETATION AVAILABLE IN [facility] Confirmed by TEST, Record  ([id number]) on 05/28/2015 6:46:37 AM      MDM   Final diagnoses:  Chest pain, unspecified chest pain type   44 year old male with a history of hypertension and smoking presents with concern for a burning chest pain.  EKG showed no acute ST changes. Chest x-ray was within normal limits. Discussed ordering labs to evaluate for signs of ACS, however patient found in the waiting room by nurse looking for his wife with IV in place and stated that he needed to leave. Nursing discussed leaving against medical  advice with patient and he left prior to my opportunity to personally describe the risks.    I personally performed the services described in this documentation, which was scribed in my presence. The recorded information has been reviewed and is accurate.    Alvira MondayErin Jakyron Fabro, MD 05/29/15 [phone]

## 2015-05-27 NOTE — ED Notes (Signed)
MD at bedside. 

## 2015-05-27 NOTE — ED Notes (Signed)
Patient transported to X-ray 

## 2015-05-27 NOTE — ED Notes (Signed)
Pt reports he was eating ribs when he developed chest pain - described as burning sensation - reports associated emesis x3 - and a "funny feeling."

## 2015-05-27 NOTE — ED Notes (Signed)
Pt on heart monitor 

## 2015-05-27 NOTE — ED Notes (Signed)
Pt not found in rm gown on bed, pt found in lobby looking for person with him, IV intact, pt states he needs to leave , AMA signed and iv d/cd

## 2016-03-05 IMAGING — CR DG SHOULDER 2+V*R*
3 series · 3 of 3 positions shown · non-contrast
Comparison: None.

CLINICAL DATA: Fall 2 months ago.  Right shoulder pain.

EXAM:
RIGHT SHOULDER - 2+ VIEW

[w shoulder ap external righ]
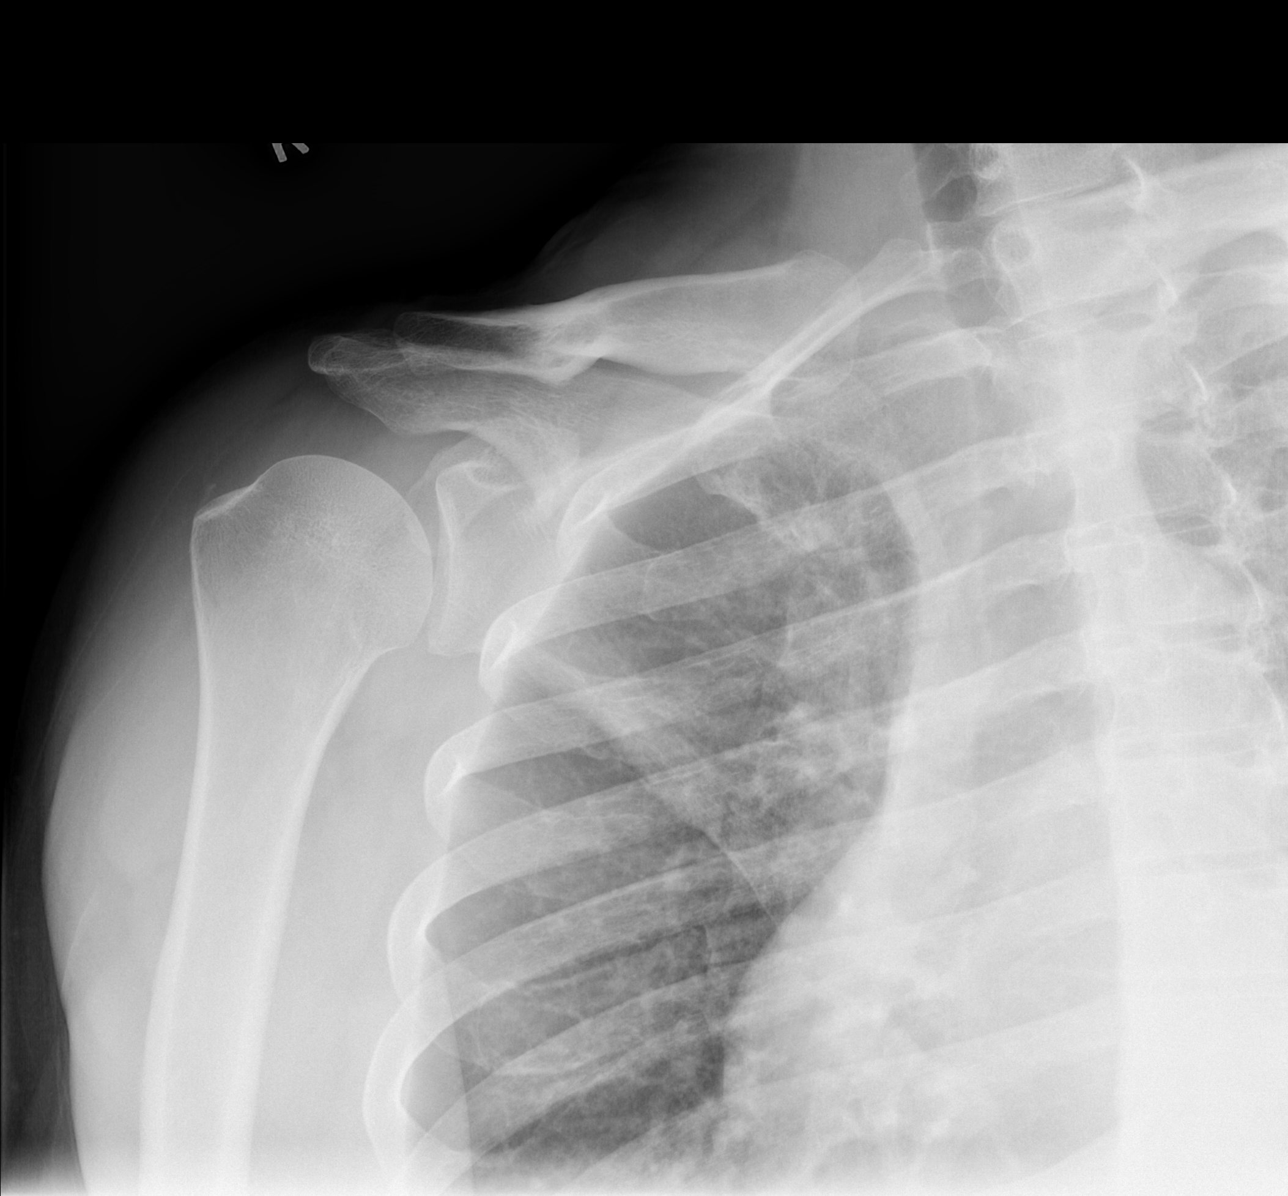

[w shoulder y view right]
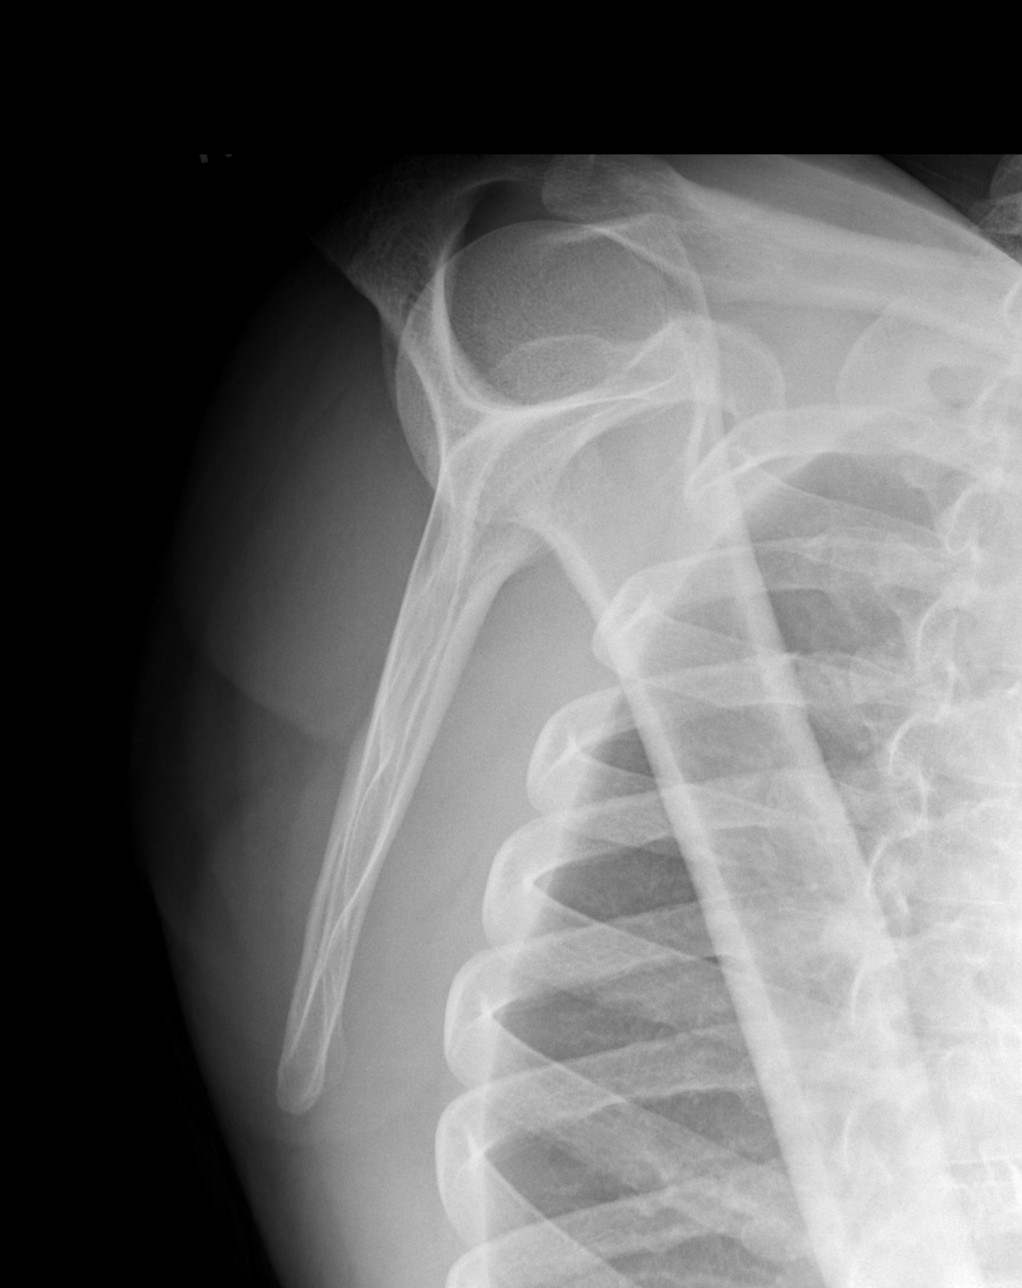

[x shoulder axillary right]
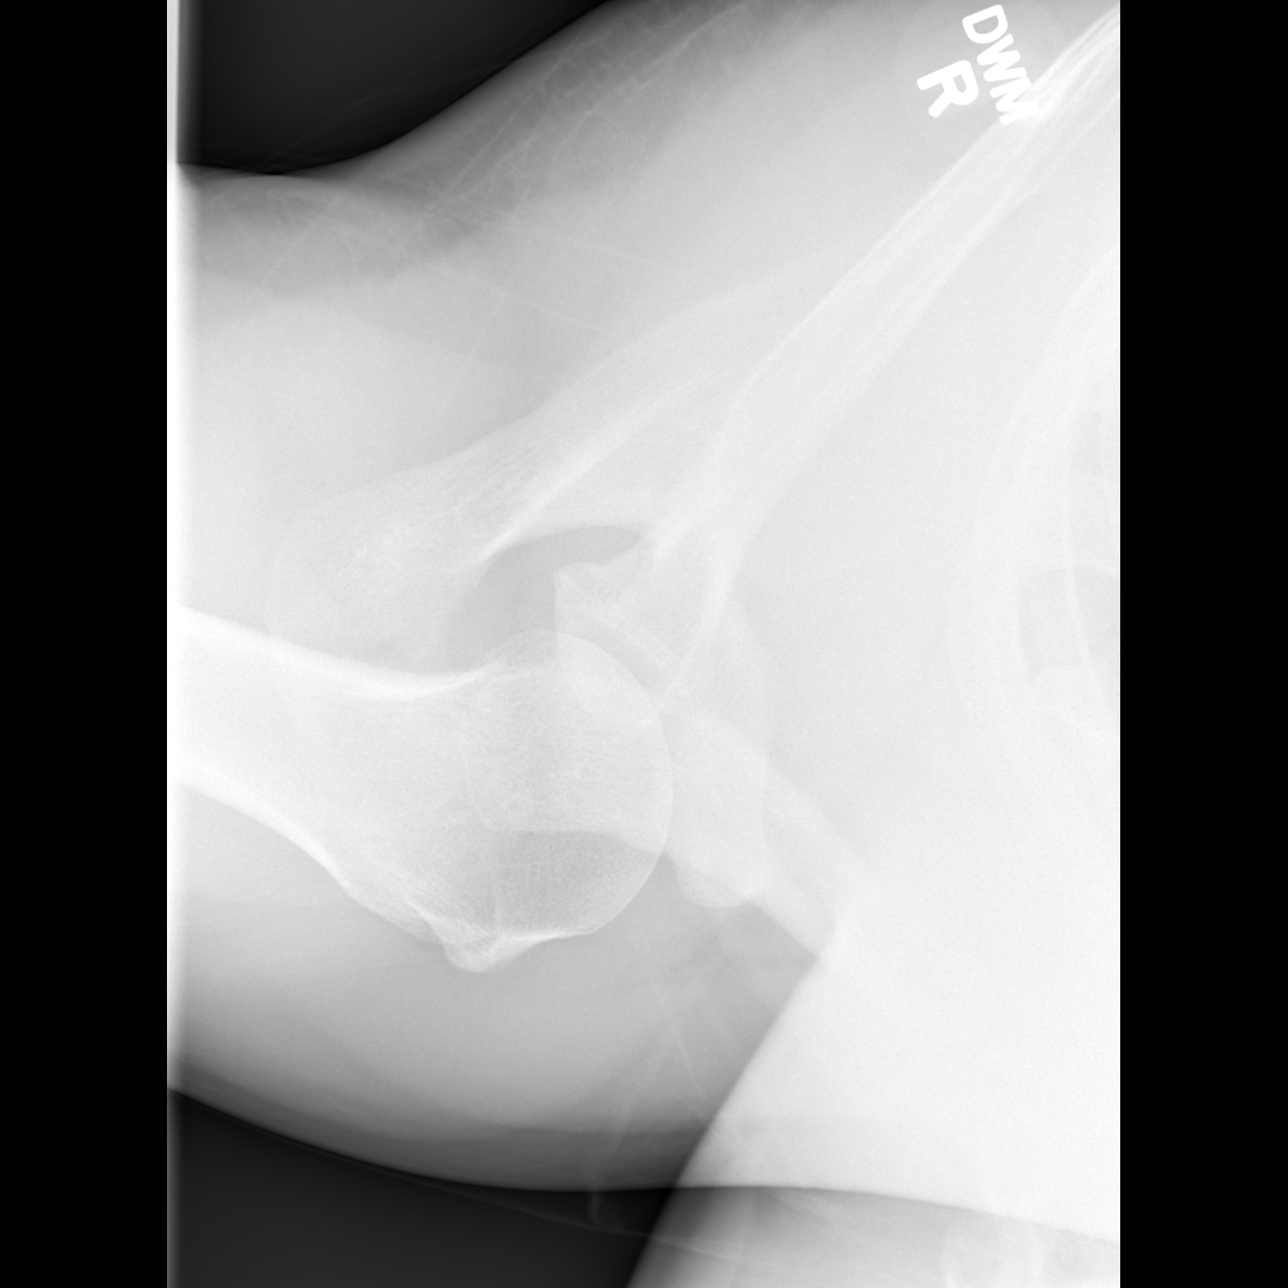

[3 of 3 positions shown; findings below may reference images not displayed]

FINDINGS: Small calcific density in the peripheral rotator cuff tendon.
Otherwise, no fracture or dislocation.
IMPRESSION: Tiny calcific density in the peripheral rotator cuff tendon is
likely related to calcium deposition disease. Tiny avulsion fracture
is not excluded.

## 2016-03-05 IMAGING — CR DG CHEST 2V
2 series · 2 of 2 positions shown · non-contrast
Comparison: 10/01/2013

CLINICAL DATA: Pain, fell down stairs 2 months ago, right shoulder
injury

EXAM:
CHEST  2 VIEW

[w chest pa]
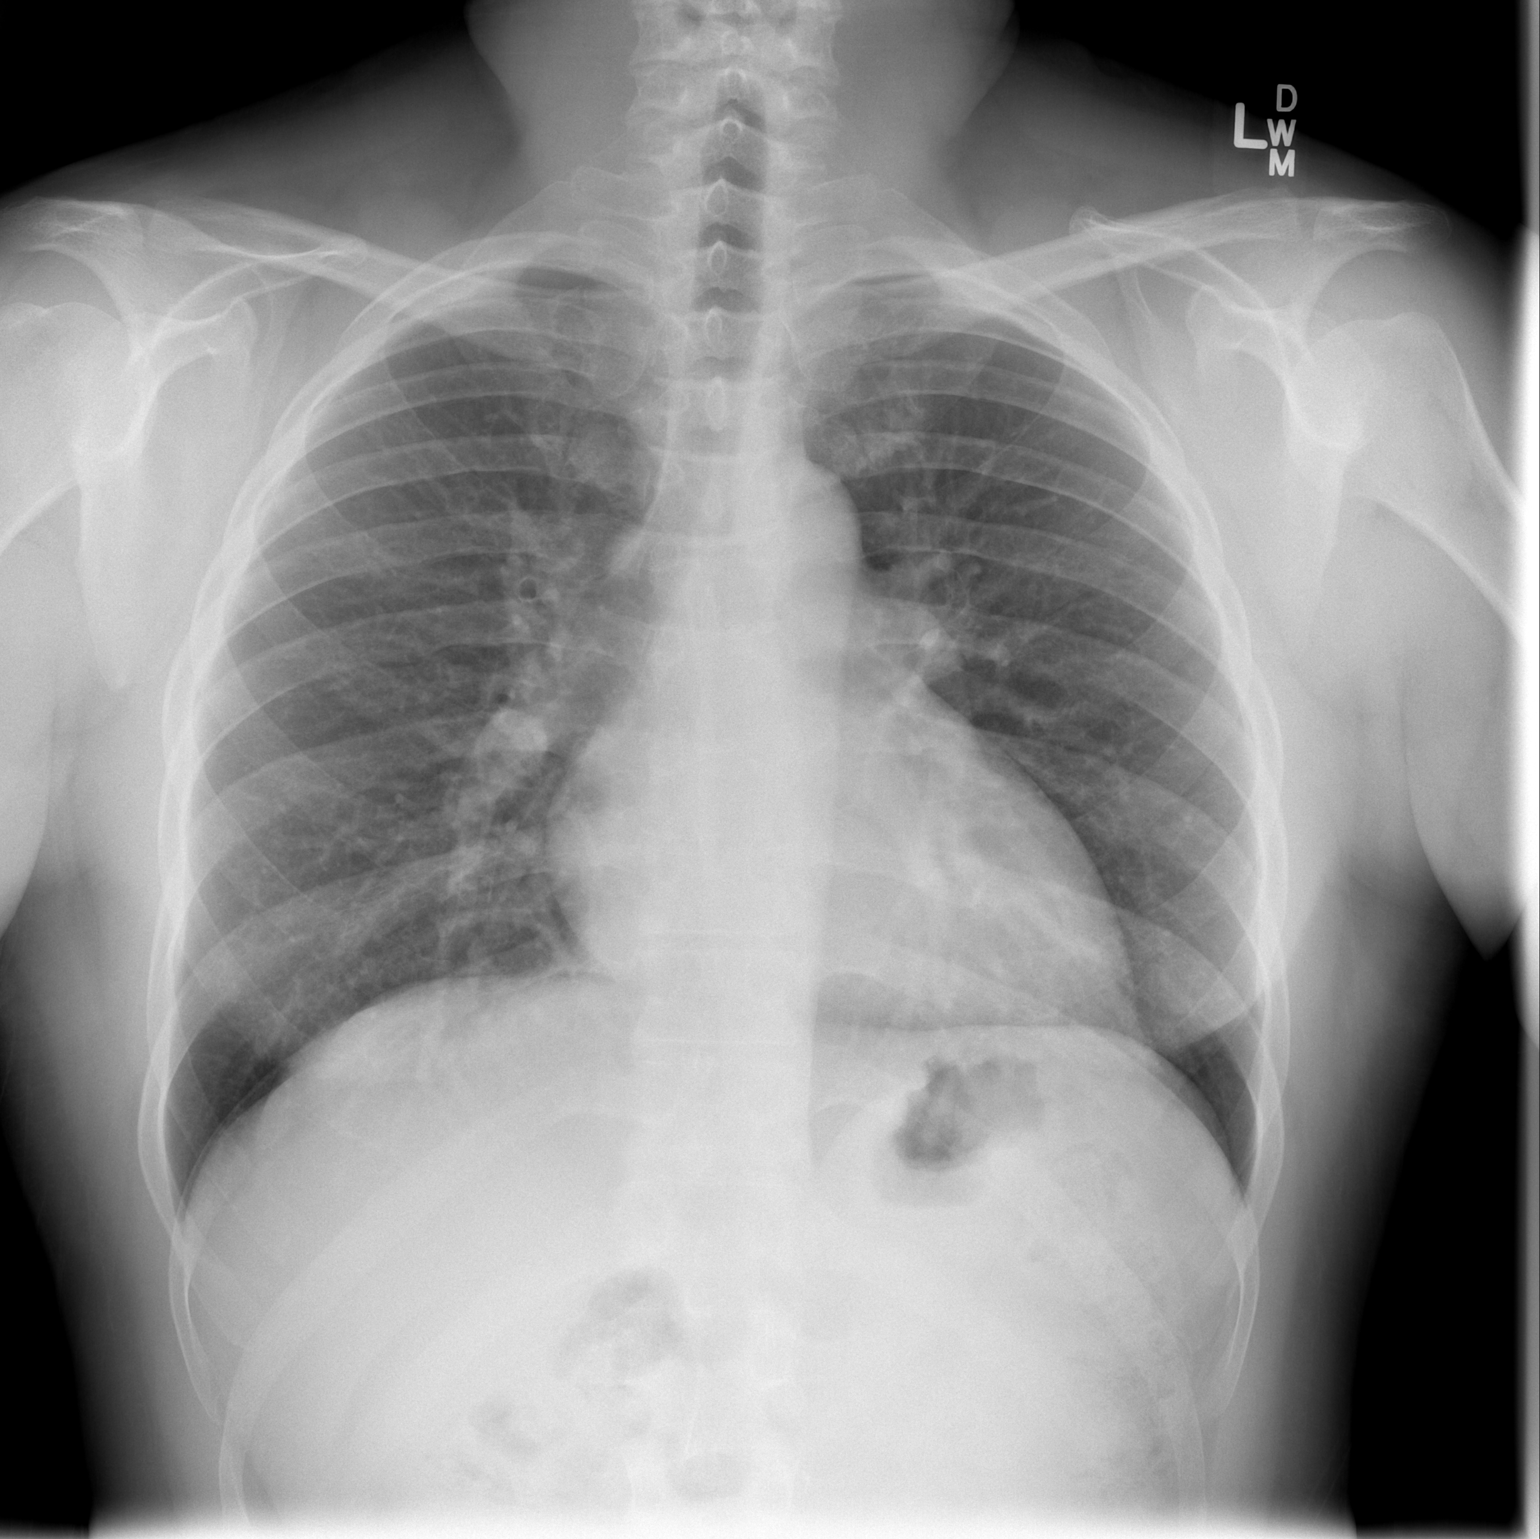

[w chest lat]
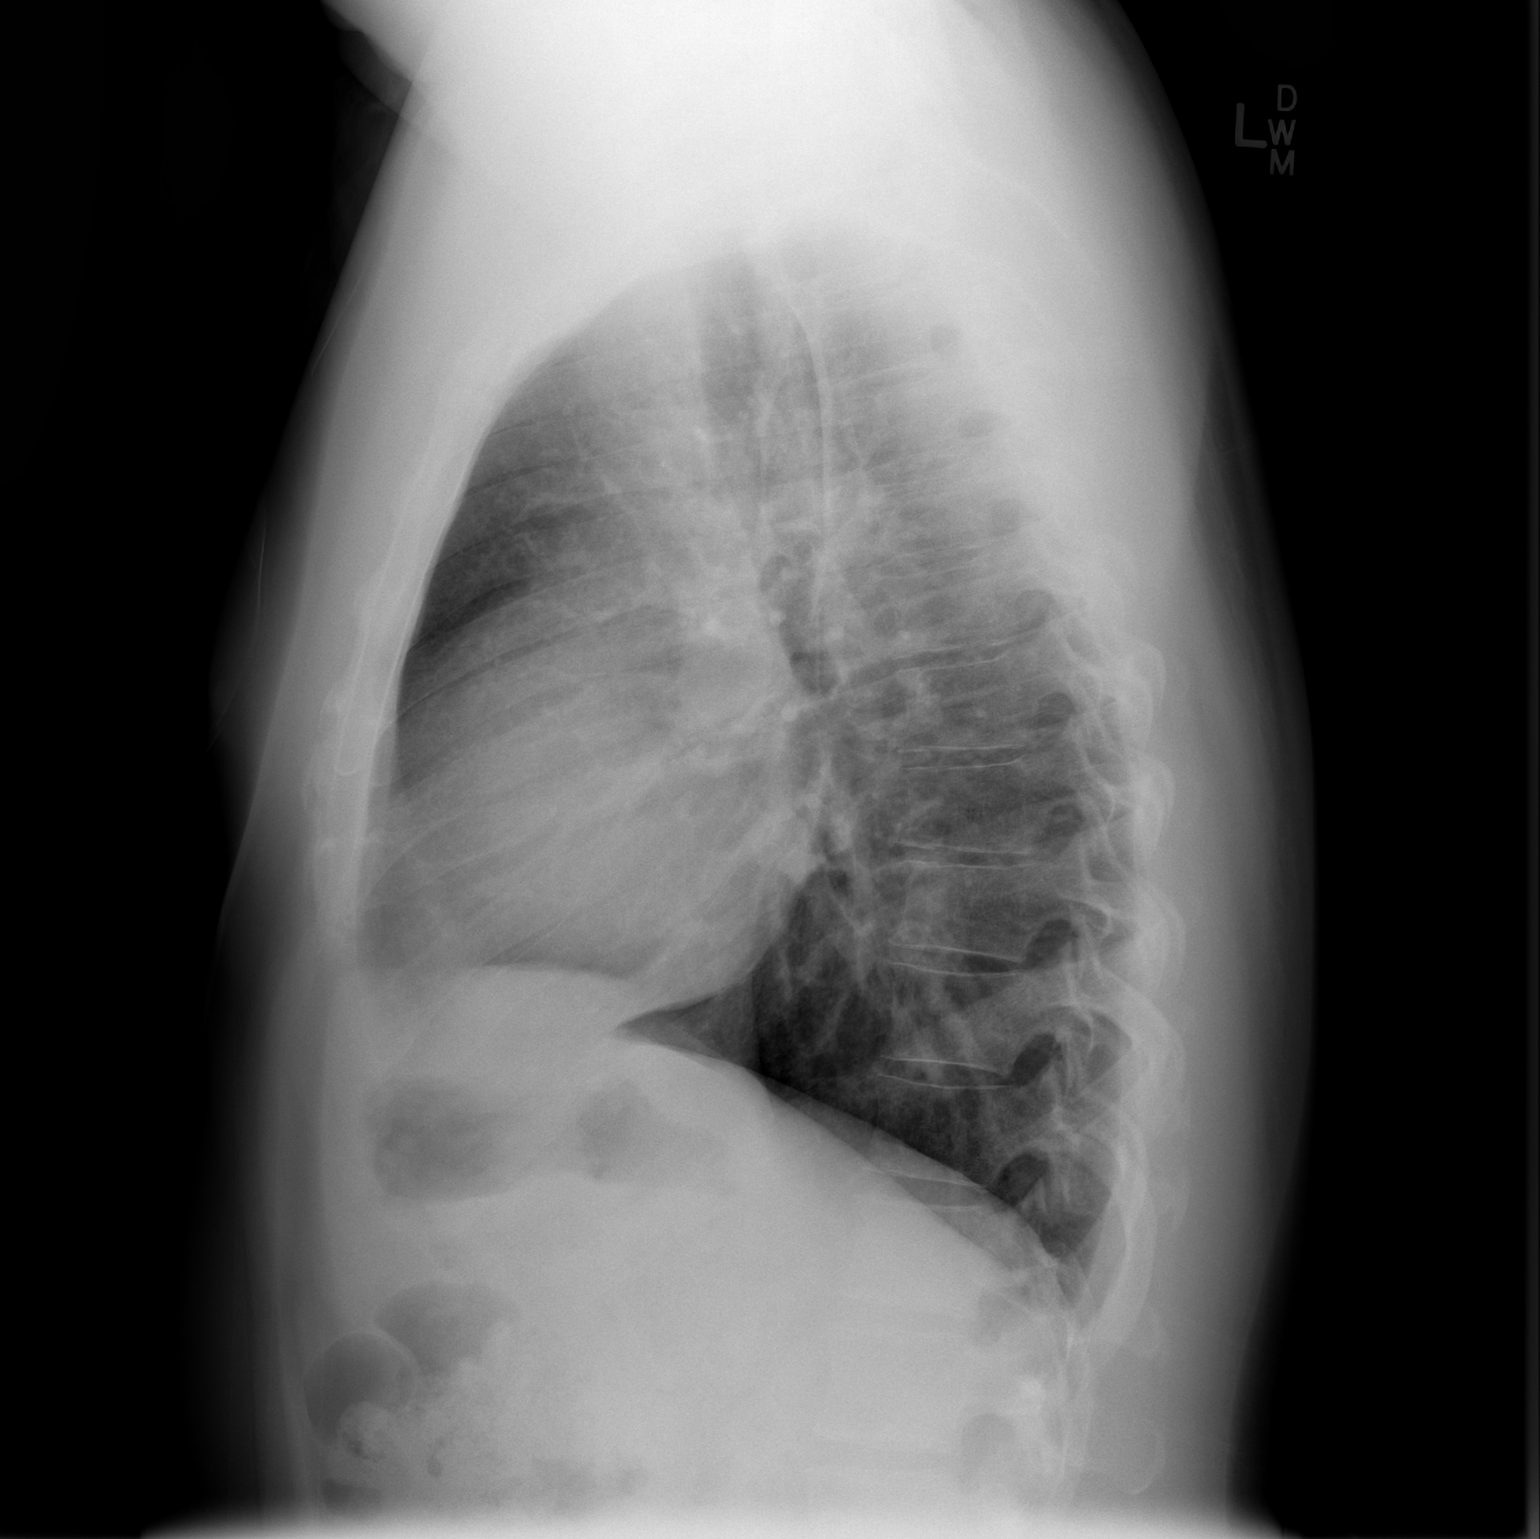

[2 of 2 positions shown; findings below may reference images not displayed]

FINDINGS: Cardiomediastinal silhouette is stable. No acute infiltrate or
pleural effusion. No pulmonary edema. Bony thorax is unremarkable.
IMPRESSION: No active cardiopulmonary disease.

## 2020-08-21 ENCOUNTER — Other Ambulatory Visit: Payer: Self-pay

## 2020-08-21 ENCOUNTER — Encounter: Payer: Self-pay | Admitting: Physician Assistant

## 2020-08-21 ENCOUNTER — Ambulatory Visit (INDEPENDENT_AMBULATORY_CARE_PROVIDER_SITE_OTHER): Payer: 59 | Admitting: Physician Assistant

## 2020-08-21 VITALS — BP 145/96 | HR 73 | Temp 98.0°F | Ht 70.0 in | Wt 216.0 lb

## 2020-08-21 DIAGNOSIS — I10 Essential (primary) hypertension: Secondary | ICD-10-CM | POA: Diagnosis not present

## 2020-08-21 DIAGNOSIS — Z13220 Encounter for screening for lipoid disorders: Secondary | ICD-10-CM | POA: Diagnosis not present

## 2020-08-21 DIAGNOSIS — F17210 Nicotine dependence, cigarettes, uncomplicated: Secondary | ICD-10-CM

## 2020-08-21 DIAGNOSIS — Z0000 Encounter for general adult medical examination without abnormal findings: Secondary | ICD-10-CM

## 2020-08-21 DIAGNOSIS — Z131 Encounter for screening for diabetes mellitus: Secondary | ICD-10-CM

## 2020-08-21 DIAGNOSIS — Z1211 Encounter for screening for malignant neoplasm of colon: Secondary | ICD-10-CM

## 2020-08-21 LAB — CBC WITH DIFFERENTIAL/PLATELET
Basophils Absolute: 0 10*3/uL (ref 0.0–0.1)
Basophils Relative: 0.6 % (ref 0.0–3.0)
Eosinophils Absolute: 0.2 10*3/uL (ref 0.0–0.7)
Eosinophils Relative: 3.5 % (ref 0.0–5.0)
HCT: 42.7 % (ref 39.0–52.0)
Hemoglobin: 14.6 g/dL (ref 13.0–17.0)
Lymphocytes Relative: 29.8 % (ref 12.0–46.0)
Lymphs Abs: 1.9 10*3/uL (ref 0.7–4.0)
MCHC: 34.1 g/dL (ref 30.0–36.0)
MCV: 85.8 fl (ref 78.0–100.0)
Monocytes Absolute: 0.6 10*3/uL (ref 0.1–1.0)
Monocytes Relative: 9.7 % (ref 3.0–12.0)
Neutro Abs: 3.7 10*3/uL (ref 1.4–7.7)
Neutrophils Relative %: 56.4 % (ref 43.0–77.0)
Platelets: 223 10*3/uL (ref 150.0–400.0)
RBC: 4.98 Mil/uL (ref 4.22–5.81)
RDW: 14.4 % (ref 11.5–15.5)
WBC: 6.5 10*3/uL (ref 4.0–10.5)

## 2020-08-21 LAB — LIPID PANEL
Cholesterol: 176 mg/dL (ref 0–200)
HDL: 40.2 mg/dL (ref >39.00)
LDL Cholesterol: 104 mg/dL — ABNORMAL HIGH (ref 0–99)
NonHDL: 135.36
Total CHOL/HDL Ratio: 4
Triglycerides: 158 mg/dL — ABNORMAL HIGH (ref 0.0–149.0)
VLDL: 31.6 mg/dL (ref 0.0–40.0)

## 2020-08-21 LAB — COMPREHENSIVE METABOLIC PANEL
ALT: 12 U/L (ref 0–53)
AST: 12 U/L (ref 0–37)
Albumin: 4.4 g/dL (ref 3.5–5.2)
Alkaline Phosphatase: 68 U/L (ref 39–117)
BUN: 11 mg/dL (ref 6–23)
CO2: 32 mEq/L (ref 19–32)
Calcium: 9.5 mg/dL (ref 8.4–10.5)
Chloride: 106 mEq/L (ref 96–112)
Creatinine, Ser: 1.17 mg/dL (ref 0.40–1.50)
GFR: 73.62 mL/min (ref >60.00)
Glucose, Bld: 94 mg/dL (ref 70–99)
Potassium: 4.1 mEq/L (ref 3.5–5.1)
Sodium: 144 mEq/L (ref 135–145)
Total Bilirubin: 0.6 mg/dL (ref 0.2–1.2)
Total Protein: 6.9 g/dL (ref 6.0–8.3)

## 2020-08-21 NOTE — Progress Notes (Signed)
New Patient Office Visit  Subjective:  Patient ID: Scott Watson, male    DOB: 06/28/1970  Age: 50 y.o. MRN: [id number]  CC:  Chief Complaint  Patient presents with  . Annual Exam    HPI Scott Watson presents for new patient establishment and annual CPE. He is a smoker (1/2 ppd) and has a history of HTN. He owns his own [employer]. He has a 81 yo and 57 yo son, as well as several grandchildren. Not married. Released from incarceration in 2020. States his health has always been important and he did annual physicals while incarcerated.   Acute concerns: None  Health maintenance: Lifestyle/ exercise: Working full-time, has been Astronomer" with exercise and hopes to get back to the gym Nutrition: Tries to stay away from fried foods, rarely fast food Mental health: No concerns, no history Caffeine: None Sleep: Averages about 6-8 hours Immunizations: Does not take flu shots, did not take COVID-19; unsure of Tetanus Colonoscopy: Has not had one, interested in doing this this year   He cold-turkey quit smoking for a few years and then started back. He is interested in quitting again.    Past Medical History:  Diagnosis Date  . GERD (gastroesophageal reflux disease)   . Hypertension     History reviewed. No pertinent surgical history.  Family History  Problem Relation Age of Onset  . Diabetes Mother   . Hypertension Mother   . Heart disease Father   . Hypertension Father   . Stroke Father     Social History   Socioeconomic History  . Marital status: Single    Spouse name: Not on file  . Number of children: Not on file  . Years of education: Not on file  . Highest education level: Not on file  Occupational History  . Not on file  Tobacco Use  . Smoking status: Current Every Day Smoker    Types: Cigarettes  . Smokeless tobacco: Never Used  . Tobacco comment: Quit March 2014  Vaping Use  . Vaping Use: Never used  Substance and Sexual Activity  .  Alcohol use: No    Alcohol/week: 0.0 standard drinks  . Drug use: No  . Sexual activity: Yes  Other Topics Concern  . Not on file  Social History Narrative  . Not on file   Social Determinants of Health   Financial Resource Strain: Not on file  Food Insecurity: Not on file  Transportation Needs: Not on file  Physical Activity: Not on file  Stress: Not on file  Social Connections: Not on file  Intimate Partner Violence: Not on file    ROS Review of Systems  Constitutional: Negative for activity change, appetite change and unexpected weight change.  HENT: Negative for congestion.   Eyes: Negative for visual disturbance.  Respiratory: Negative for apnea and shortness of breath.   Cardiovascular: Negative for chest pain.  Gastrointestinal: Negative for abdominal pain and blood in stool.  Endocrine: Negative for polydipsia, polyphagia and polyuria.  Genitourinary: Negative for difficulty urinating.  Musculoskeletal: Negative for arthralgias.  Skin: Negative for rash.  Neurological: Negative for seizures, weakness and headaches.  Psychiatric/Behavioral: Negative for sleep disturbance and suicidal ideas.    Objective:   Today's Vitals: BP (!) 145/96   Pulse 73   Temp 98 F (36.7 C)   Ht 5\' 10"  (1.778 m)   Wt 216 lb (98 kg)   SpO2 96%   BMI 30.99 kg/m   Physical Exam Vitals and nursing note  reviewed.  Constitutional:      General: He is not in acute distress.    Appearance: Normal appearance. He is not toxic-appearing.  HENT:     Head: Normocephalic and atraumatic.     Right Ear: Tympanic membrane, ear canal and external ear normal.     Left Ear: Tympanic membrane, ear canal and external ear normal.     Nose: Nose normal.     Mouth/Throat:     Mouth: Mucous membranes are moist.     Pharynx: Oropharynx is clear.  Eyes:     Extraocular Movements: Extraocular movements intact.     Conjunctiva/sclera: Conjunctivae normal.     Pupils: Pupils are equal, round, and  reactive to light.  Cardiovascular:     Rate and Rhythm: Normal rate and regular rhythm.     Pulses: Normal pulses.     Heart sounds: Normal heart sounds.  Pulmonary:     Effort: Pulmonary effort is normal.     Breath sounds: Normal breath sounds.  Abdominal:     General: Abdomen is flat. Bowel sounds are normal.     Palpations: Abdomen is soft.     Tenderness: There is no abdominal tenderness.  Musculoskeletal:        General: Normal range of motion.     Cervical back: Normal range of motion and neck supple.  Skin:    General: Skin is warm and dry.  Neurological:     General: No focal deficit present.     Mental Status: He is alert and oriented to person, place, and time.  Psychiatric:        Mood and Affect: Mood normal.        Behavior: Behavior normal.     Assessment & Plan:   Problem List Items Addressed This Visit   None   Visit Diagnoses    Encounter for annual physical exam    -  Primary   Relevant Orders   CBC with Differential/Platelet   Comprehensive metabolic panel   Lipid panel   Diabetes mellitus screening       Relevant Orders   CBC with Differential/Platelet   Comprehensive metabolic panel   Essential hypertension       Relevant Medications   LOSARTAN POTASSIUM PO   Other Relevant Orders   CBC with Differential/Platelet   Screening for cholesterol level       Relevant Orders   Lipid panel   Screening for colon cancer       Relevant Orders   Ambulatory referral to Gastroenterology   Cigarette nicotine dependence without complication       Relevant Orders   Lipid panel      Outpatient Encounter Medications as of 08/21/2020  Medication Sig  . LOSARTAN POTASSIUM PO Take 50 mg by mouth once.  . [DISCONTINUED] telmisartan (MICARDIS) 80 MG tablet Take 40 mg by mouth daily.    No facility-administered encounter medications on file as of 08/21/2020.    Follow-up: No follow-ups on file.   1. Encounter for annual physical exam Age-appropriate  screening and counseling performed today. Will check labs and call with results.   2. Diabetes mellitus screening Labs today  3. Essential hypertension Elevated. He is taking Losartan 50 mg daily and monitoring at home. Encouraged him to lower salt in diet, increase water intake and cardio exercise. Will recheck in 3 months and if no improvement will need to adjust medication.  4. Screening for cholesterol level Labs today  5. Screening for  colon cancer Referral to GI for this as he is at recommended age for screening. No family hx.   6. Cigarette nicotine dependence without complication He wants to quit smoking. He wants to attempt this on his own and will reach out for help if needed.  This visit occurred during the SARS-CoV-2 public health emergency.  Safety protocols were in place, including screening questions prior to the visit, additional usage of staff PPE, and extensive cleaning of exam room while observing appropriate contact time as indicated for disinfecting solutions.    Ramanda Paules M Javien Tesch, PA-C

## 2020-08-21 NOTE — Patient Instructions (Signed)
It was great to meet you today! Please go to the lab and I will call with results or send through MyChart. Someone will call about referral for colonoscopy.  Goals this year: Increase cardio activity and quit smoking! You can do it!  Let's see you back in 3 months and reassess these activities as well as your blood pressure.    Preventive Care 3940-49 Years Old, Male Preventive care refers to lifestyle choices and visits with your health care provider that can promote health and wellness. This includes:  A yearly physical exam. This is also called an annual wellness visit.  Regular dental and eye exams.  Immunizations.  Screening for certain conditions.  Healthy lifestyle choices, such as: ? Eating a healthy diet. ? Getting regular exercise. ? Not using drugs or products that contain nicotine and tobacco. ? Limiting alcohol use. What can I expect for my preventive care visit? Physical exam Your health care provider will check your:  Height and weight. These may be used to calculate your BMI (body mass index). BMI is a measurement that tells if you are at a healthy weight.  Heart rate and blood pressure.  Body temperature.  Skin for abnormal spots. Counseling Your health care provider may ask you questions about your:  Past medical problems.  Family's medical history.  Alcohol, tobacco, and drug use.  Emotional well-being.  Home life and relationship well-being.  Sexual activity.  Diet, exercise, and sleep habits.  Work and work [employer].  Access to firearms. What immunizations do I need? Vaccines are usually given at various ages, according to a schedule. Your health care provider will recommend vaccines for you based on your age, medical history, and lifestyle or other factors, such as travel or where you work.   What tests do I need? Blood tests  Lipid and cholesterol levels. These may be checked every 5 years, or more often if you are over 49 years  old.  Hepatitis C test.  Hepatitis B test. Screening  Lung cancer screening. You may have this screening every year starting at age 49 if you have a 30-pack-year history of smoking and currently smoke or have quit within the past 15 years.  Prostate cancer screening. Recommendations will vary depending on your family history and other risks.  Genital exam to check for testicular cancer or hernias.  Colorectal cancer screening. ? All adults should have this screening starting at age 49 and continuing until age 49. ? Your health care provider may recommend screening at age 49 if you are at increased risk. ? You will have tests every 1-10 years, depending on your results and the type of screening test.  Diabetes screening. ? This is done by checking your blood sugar (glucose) after you have not eaten for a while (fasting). ? You may have this done every 1-3 years.  STD (sexually transmitted disease) testing, if you are at risk. Follow these instructions at home: Eating and drinking  Eat a diet that includes fresh fruits and vegetables, whole grains, lean protein, and low-fat dairy products.  Take vitamin and mineral supplements as recommended by your health care provider.  Do not drink alcohol if your health care provider tells you not to drink.  If you drink alcohol: ? Limit how much you have to 0-2 drinks a day. ? Be aware of how much alcohol is in your drink. In the [place] one drink equals one 12 oz bottle of beer (355 mL), one 5 oz glass of wine (  148 mL), or one 1 oz glass of hard liquor (44 mL).   Lifestyle  Take daily care of your teeth and gums. Brush your teeth every morning and night with fluoride toothpaste. Floss one time each day.  Stay active. Exercise for at least 30 minutes 5 or more days each week.  Do not use any products that contain nicotine or tobacco, such as cigarettes, e-cigarettes, and chewing tobacco. If you need help quitting, ask your health care  provider.  Do not use drugs.  If you are sexually active, practice safe sex. Use a condom or other form of protection to prevent STIs (sexually transmitted infections).  If told by your health care provider, take low-dose aspirin daily starting at age 51.  Find healthy ways to cope with stress, such as: ? Meditation, yoga, or listening to music. ? Journaling. ? Talking to a trusted person. ? Spending time with friends and family. Safety  Always wear your seat belt while driving or riding in a vehicle.  Do not drive: ? If you have been drinking alcohol. Do not ride with someone who has been drinking. ? When you are tired or distracted. ? While texting.  Wear a helmet and other protective equipment during sports activities.  If you have firearms in your house, make sure you follow all gun safety procedures. What's next?  Go to your health care provider once a year for an annual wellness visit.  Ask your health care provider how often you should have your eyes and teeth checked.  Stay up to date on all vaccines. This information is not intended to replace advice given to you by your health care provider. Make sure you discuss any questions you have with your health care provider. Document Revised: 02/27/2019 Document Reviewed: 05/25/2018 Elsevier Patient Education  2021 Elsevier Inc.    Managing Your Hypertension Hypertension, also called high blood pressure, is when the force of the blood pressing against the walls of the arteries is too strong. Arteries are blood vessels that carry blood from your heart throughout your body. Hypertension forces the heart to work harder to pump blood and may cause the arteries to become narrow or stiff. Understanding blood pressure readings Your personal target blood pressure may vary depending on your medical conditions, your age, and other factors. A blood pressure reading includes a higher number over a lower number. Ideally, your blood  pressure should be below 120/80. You should know that:  The first, or top, number is called the systolic pressure. It is a measure of the pressure in your arteries as your heart beats.  The second, or bottom number, is called the diastolic pressure. It is a measure of the pressure in your arteries as the heart relaxes. Blood pressure is classified into four stages. Based on your blood pressure reading, your health care provider may use the following stages to determine what type of treatment you need, if any. Systolic pressure and diastolic pressure are measured in a unit called mmHg. Normal  Systolic pressure: below 120.  Diastolic pressure: below 80. Elevated  Systolic pressure: 120-129.  Diastolic pressure: below 80. Hypertension stage 1  Systolic pressure: 130-139.  Diastolic pressure: 80-89. Hypertension stage 2  Systolic pressure: 140 or above.  Diastolic pressure: 90 or above. How can this condition affect me? Managing your hypertension is an important responsibility. Over time, hypertension can damage the arteries and decrease blood flow to important parts of the body, including the brain, heart, and kidneys. Having untreated  or uncontrolled hypertension can lead to:  A heart attack.  A stroke.  A weakened blood vessel (aneurysm).  Heart failure.  Kidney damage.  Eye damage.  Metabolic syndrome.  Memory and concentration problems.  Vascular dementia. What actions can I take to manage this condition? Hypertension can be managed by making lifestyle changes and possibly by taking medicines. Your health care provider will help you make a plan to bring your blood pressure within a normal range. Nutrition  Eat a diet that is high in fiber and potassium, and low in salt (sodium), added sugar, and fat. An example eating plan is called the Dietary Approaches to Stop Hypertension (DASH) diet. To eat this way: ? Eat plenty of fresh fruits and vegetables. Try to fill  one-half of your plate at each meal with fruits and vegetables. ? Eat whole grains, such as whole-wheat pasta, brown rice, or whole-grain bread. Fill about one-fourth of your plate with whole grains. ? Eat low-fat dairy products. ? Avoid fatty cuts of meat, processed or cured meats, and poultry with skin. Fill about one-fourth of your plate with lean proteins such as fish, chicken without skin, beans, eggs, and tofu. ? Avoid pre-made and processed foods. These tend to be higher in sodium, added sugar, and fat.  Reduce your daily sodium intake. Most people with hypertension should eat less than 1,500 mg of sodium a day.   Lifestyle  Work with your health care provider to maintain a healthy body weight or to lose weight. Ask what an ideal weight is for you.  Get at least 30 minutes of exercise that causes your heart to beat faster (aerobic exercise) most days of the week. Activities may include walking, swimming, or biking.  Include exercise to strengthen your muscles (resistance exercise), such as weight lifting, as part of your weekly exercise routine. Try to do these types of exercises for 30 minutes at least 3 days a week.  Do not use any products that contain nicotine or tobacco, such as cigarettes, e-cigarettes, and chewing tobacco. If you need help quitting, ask your health care provider.  Control any long-term (chronic) conditions you have, such as high cholesterol or diabetes.  Identify your sources of stress and find ways to manage stress. This may include meditation, deep breathing, or making time for fun activities.   Alcohol use  Do not drink alcohol if: ? Your health care provider tells you not to drink. ? You are pregnant, may be pregnant, or are planning to become pregnant.  If you drink alcohol: ? Limit how much you use to:  0-1 drink a day for women.  0-2 drinks a day for men. ? Be aware of how much alcohol is in your drink. In the [place] one drink equals one 12 oz  bottle of beer (355 mL), one 5 oz glass of wine (148 mL), or one 1 oz glass of hard liquor (44 mL). Medicines Your health care provider may prescribe medicine if lifestyle changes are not enough to get your blood pressure under control and if:  Your systolic blood pressure is 130 or higher.  Your diastolic blood pressure is 80 or higher. Take medicines only as told by your health care provider. Follow the directions carefully. Blood pressure medicines must be taken as told by your health care provider. The medicine does not work as well when you skip doses. Skipping doses also puts you at risk for problems. Monitoring Before you monitor your blood pressure:  Do not smoke,  drink caffeinated beverages, or exercise within 30 minutes before taking a measurement.  Use the bathroom and empty your bladder (urinate).  Sit quietly for at least 5 minutes before taking measurements. Monitor your blood pressure at home as told by your health care provider. To do this:  Sit with your back straight and supported.  Place your feet flat on the floor. Do not cross your legs.  Support your arm on a flat surface, such as a table. Make sure your upper arm is at heart level.  Each time you measure, take two or three readings one minute apart and record the results. You may also need to have your blood pressure checked regularly by your health care provider.   General information  Talk with your health care provider about your diet, exercise habits, and other lifestyle factors that may be contributing to hypertension.  Review all the medicines you take with your health care provider because there may be side effects or interactions.  Keep all visits as told by your health care provider. Your health care provider can help you create and adjust your plan for managing your high blood pressure. Where to find more information  National Heart, Lung, and Blood Institute: PopSteam.is  American Heart  Association: www.heart.org Contact a health care provider if:  You think you are having a reaction to medicines you have taken.  You have repeated (recurrent) headaches.  You feel dizzy.  You have swelling in your ankles.  You have trouble with your vision. Get help right away if:  You develop a severe headache or confusion.  You have unusual weakness or numbness, or you feel faint.  You have severe pain in your chest or abdomen.  You vomit repeatedly.  You have trouble breathing. These symptoms may represent a serious problem that is an emergency. Do not wait to see if the symptoms will go away. Get medical help right away. Call your local emergency services (911 in the [place] Do not drive yourself to the hospital. Summary  Hypertension is when the force of blood pumping through your arteries is too strong. If this condition is not controlled, it may put you at risk for serious complications.  Your personal target blood pressure may vary depending on your medical conditions, your age, and other factors. For most people, a normal blood pressure is less than 120/80.  Hypertension is managed by lifestyle changes, medicines, or both.  Lifestyle changes to help manage hypertension include losing weight, eating a healthy, low-sodium diet, exercising more, stopping smoking, and limiting alcohol. This information is not intended to replace advice given to you by your health care provider. Make sure you discuss any questions you have with your health care provider. Document Revised: 07/06/2019 Document Reviewed: 05/01/2019 Elsevier Patient Education  2021 [employer].

## 2020-09-30 ENCOUNTER — Encounter (HOSPITAL_BASED_OUTPATIENT_CLINIC_OR_DEPARTMENT_OTHER): Payer: Self-pay

## 2020-09-30 ENCOUNTER — Other Ambulatory Visit: Payer: Self-pay

## 2020-09-30 ENCOUNTER — Emergency Department (HOSPITAL_BASED_OUTPATIENT_CLINIC_OR_DEPARTMENT_OTHER)
Admission: EM | Admit: 2020-09-30 | Discharge: 2020-09-30 | Disposition: A | Discharge status: Home / Self Care | Payer: 59 | Attending: Emergency Medicine | Admitting: Emergency Medicine

## 2020-09-30 DIAGNOSIS — R519 Headache, unspecified: Secondary | ICD-10-CM | POA: Diagnosis not present

## 2020-09-30 DIAGNOSIS — R42 Dizziness and giddiness: Secondary | ICD-10-CM | POA: Insufficient documentation

## 2020-09-30 DIAGNOSIS — F17210 Nicotine dependence, cigarettes, uncomplicated: Secondary | ICD-10-CM | POA: Insufficient documentation

## 2020-09-30 DIAGNOSIS — Z79899 Other long term (current) drug therapy: Secondary | ICD-10-CM | POA: Insufficient documentation

## 2020-09-30 DIAGNOSIS — Z720 Tobacco use: Secondary | ICD-10-CM

## 2020-09-30 DIAGNOSIS — I10 Essential (primary) hypertension: Secondary | ICD-10-CM | POA: Diagnosis not present

## 2020-09-30 DIAGNOSIS — Z0130 Encounter for examination of blood pressure without abnormal findings: Secondary | ICD-10-CM

## 2020-09-30 LAB — BASIC METABOLIC PANEL
Anion gap: 10 (ref 5–15)
BUN: 11 mg/dL (ref 6–20)
CO2: 25 mmol/L (ref 22–32)
Calcium: 9.2 mg/dL (ref 8.9–10.3)
Chloride: 103 mmol/L (ref 98–111)
Creatinine, Ser: 1.15 mg/dL (ref 0.61–1.24)
GFR, Estimated: >60 mL/min (ref >60)
Glucose, Bld: 103 mg/dL — ABNORMAL HIGH (ref 70–99)
Potassium: 3.8 mmol/L (ref 3.5–5.1)
Sodium: 138 mmol/L (ref 135–145)

## 2020-09-30 MED ORDER — ACETAMINOPHEN 325 MG PO TABS
650.0000 mg | ORAL_TABLET | Freq: Once | ORAL | Status: AC
  Administered 2020-09-30: 650 mg via ORAL
  Filled 2020-09-30: qty 2

## 2020-09-30 NOTE — ED Triage Notes (Signed)
Pt presents with complaints of headache and lightheadedness that started this morning when he woke up. States he took his BP and it was 180 systolic. Is compliant with his BP rx.

## 2020-09-30 NOTE — Discharge Instructions (Signed)
Get help right away if you: Develop a severe headache or confusion. Have unusual weakness or numbness. Feel faint. Have severe pain in your chest or abdomen. Vomit repeatedly. Have trouble breathing. 

## 2020-09-30 NOTE — ED Provider Notes (Signed)
[facility] EMERGENCY DEPARTMENT Provider Note   CSN: [id number] Arrival date & time: 09/30/20  1534     History Chief Complaint  Patient presents with  . Hypertension    Scott Watson is a 50 y.o. male who presents emergency department with concerns about his blood pressure.  Patient states that this morning he awoke with mild headache, and some lightheadedness and generally feeling well.  He woke up about 12 noon because he was up until 2:00 in the morning playing speeds, smoking cigarettes, eating pizza and also had 1 [employer].  Patient states that when he awoke he also smoked a cigarette.  He took his blood pressure which she said was 180 systolic and decided to take his blood pressure medicine.  He said he took it again about 5 minutes later and had dropped down to 160 and then 5 minutes later took it again on the same arm and it was elevated to 170.  He decided to come in for evaluation.  He denies any active headache, blurred vision, lightheadedness at this time.  HPI     Past Medical History:  Diagnosis Date  . GERD (gastroesophageal reflux disease)   . Hypertension     Patient Active Problem List   Diagnosis Date Noted  . Left shoulder pain 08/22/2014  . HTN (hypertension) 02/02/2013    Past Surgical History:  Procedure Laterality Date  . EYE SURGERY         Family History  Problem Relation Age of Onset  . Diabetes Mother   . Hypertension Mother   . Heart disease Father   . Hypertension Father   . Stroke Father     Social History   Tobacco Use  . Smoking status: Current Every Day Smoker    Packs/day: 1.00    Types: Cigarettes  . Smokeless tobacco: Never Used  Vaping Use  . Vaping Use: Never used  Substance Use Topics  . Alcohol use: Yes    Alcohol/week: 0.0 standard drinks  . Drug use: No    Home Medications Prior to Admission medications   Medication Sig Start Date End Date Taking? Authorizing Provider  LOSARTAN POTASSIUM PO  Take 50 mg by mouth once.    [provider]    Allergies    Patient has no known allergies.  Review of Systems   Review of Systems Ten systems reviewed and are negative for acute change, except as noted in the HPI.   Physical Exam Updated Vital Signs BP (!) 145/91 (BP Location: Right Arm)   Pulse 72   Temp 98.2 F (36.8 C) (Oral)   Resp 18   Ht 5\' 10"  (1.778 m)   Wt 95.3 kg   SpO2 100%   BMI 30.13 kg/m   Physical Exam Vitals and nursing note reviewed.  Constitutional:      General: He is not in acute distress.    Appearance: He is well-developed. He is not diaphoretic.  HENT:     Head: Normocephalic and atraumatic.  Eyes:     General: No scleral icterus.    Conjunctiva/sclera: Conjunctivae normal.  Cardiovascular:     Rate and Rhythm: Normal rate and regular rhythm.     Heart sounds: Normal heart sounds.  Pulmonary:     Effort: Pulmonary effort is normal. No respiratory distress.     Breath sounds: Normal breath sounds.  Abdominal:     Palpations: Abdomen is soft.     Tenderness: There is no abdominal tenderness.  Musculoskeletal:     Cervical back: Normal range of motion and neck supple.  Skin:    General: Skin is warm and dry.  Neurological:     General: No focal deficit present.     Mental Status: He is alert and oriented to person, place, and time.     Cranial Nerves: No cranial nerve deficit.     Sensory: No sensory deficit.     Motor: No weakness.     Coordination: Coordination normal.     Gait: Gait normal.  Psychiatric:        Behavior: Behavior normal.     ED Results / Procedures / Treatments   Labs (all labs ordered are listed, but only abnormal results are displayed) Labs Reviewed  BASIC METABOLIC PANEL    EKG None  Radiology No results found.  Procedures Procedures  Medications Ordered in ED Medications  acetaminophen (TYLENOL) tablet 650 mg (has no administration in time range)    ED Course  I have reviewed the  triage vital signs and the nursing notes.  Pertinent labs & imaging results that were available during my care of the patient were reviewed by me and considered in my medical decision making (see chart for details).    MDM Rules/Calculators/A&P                          Patient here with elevated blood pressure reading this morning after staying up late, smoking, eating salty foods.  I suspect that the majority of his symptoms were due to his lifestyle choices last evening. The patient was counseled on the dangers of tobacco use, and was advised to quit. Reviewed strategies to maximize success, including removing cigarettes and smoking materials from environment, stress management, substitution of other forms of reinforcement, support of family/friends and written materials. Patient advised to follow-up with his primary care physician.  I ordered a metabolic panel which shows no evidence of kidney injury.  He has no chest pain or neurologic complaints at this time, a normal neurologic examination.  Discussed some lifestyle modification choices along with smoking cessation.  He is compliant with taking his medication daily and appears appropriate for discharge with close outpatient follow-up at this time. Final Clinical Impression(s) / ED Diagnoses Final diagnoses:  None    Rx / DC Orders ED Discharge Orders    None       Arthor Captain, PA-C 09/30/20 1722    Gwyneth Sprout, MD 09/30/20 2336

## 2020-10-16 ENCOUNTER — Other Ambulatory Visit: Payer: Self-pay

## 2020-10-16 ENCOUNTER — Telehealth: Payer: Self-pay

## 2020-10-16 MED ORDER — LOSARTAN POTASSIUM 50 MG PO TABS: 50.0000 mg | ORAL_TABLET | Freq: Every day | ORAL | 0 refills | Status: DC

## 2020-10-16 NOTE — Telephone Encounter (Signed)
MEDICATION: LOSARTAN POTASSIUM PO  PHARMACY: [facility] [address] Comments:   **Let patient know to contact pharmacy at the end of the day to make sure medication is ready. **  ** Please notify patient to allow 48-72 hours to process**  **Encourage patient to contact the pharmacy for refills or they can request refills through [facility]**

## 2020-10-16 NOTE — Telephone Encounter (Signed)
Rx sent in

## 2020-10-24 ENCOUNTER — Other Ambulatory Visit: Payer: Self-pay

## 2020-10-24 ENCOUNTER — Encounter: Payer: Self-pay | Admitting: Family Medicine

## 2020-10-24 ENCOUNTER — Telehealth (INDEPENDENT_AMBULATORY_CARE_PROVIDER_SITE_OTHER): Payer: 59 | Admitting: Family Medicine

## 2020-10-24 DIAGNOSIS — J0140 Acute pansinusitis, unspecified: Secondary | ICD-10-CM | POA: Diagnosis not present

## 2020-10-24 DIAGNOSIS — R059 Cough, unspecified: Secondary | ICD-10-CM | POA: Diagnosis not present

## 2020-10-24 MED ORDER — PROMETHAZINE-DM 6.25-15 MG/5ML PO SYRP: 5.0000 mL | ORAL_SOLUTION | Freq: Four times a day (QID) | ORAL | 0 refills | Status: DC | PRN

## 2020-10-24 MED ORDER — AMOXICILLIN-POT CLAVULANATE 875-125 MG PO TABS: 1.0000 | ORAL_TABLET | Freq: Two times a day (BID) | ORAL | 0 refills | Status: DC

## 2020-10-24 NOTE — Assessment & Plan Note (Signed)
abx per orderes  Pt has scheduled a covid test and will call us back if positive or if symptoms worsen

## 2020-10-24 NOTE — Progress Notes (Addendum)
Virtual telephone visit    Virtual Visit via Telephone Note   This visit type was conducted due to national recommendations for restrictions regarding the COVID-19 Pandemic (e.g. social distancing) in an effort to limit this patient's exposure and mitigate transmission in our community. Due to his co-morbid illnesses, this patient is at least at moderate risk for complications without adequate follow up. This format is felt to be most appropriate for this patient at this time. The patient did not have access to video technology or had technical difficulties with video requiring transitioning to audio format only (telephone). Physical exam was limited to content and character of the telephone converstion. Scott Watson was able to get the patient set up on a telephone visit.  Video connection was lost at  >50% of the duration of the visit, at which time the remainder of the visit was completed via audio only.  Patient location: home Patient and provider in visit Provider location: Office  I discussed the limitations of evaluation and management by telemedicine and the availability of in person appointments. The patient expressed understanding and agreed to proceed.   Visit Date: 10/24/2020  Today's healthcare provider: Donato Schultz, DO     Subjective:    Patient ID: Scott Watson, male    DOB: 09/23/1970, 50 y.o.   MRN: [id number]  Chief Complaint  Patient presents with  . Sore Throat    HPI Patient is in today for sore throat and congestion since last sat .  He is taking mucinex cold and flu    He is also drinking hot tea.  Pt had covid test---neg yesterday  + nasal congestion   No fever    Past Medical History:  Diagnosis Date  . GERD (gastroesophageal reflux disease)   . Hypertension     Past Surgical History:  Procedure Laterality Date  . EYE SURGERY      Family History  Problem Relation Age of Onset  . Diabetes Mother   . Hypertension Mother    . Heart disease Father   . Hypertension Father   . Stroke Father     Social History   Socioeconomic History  . Marital status: Single    Spouse name: Not on file  . Number of children: Not on file  . Years of education: Not on file  . Highest education level: Not on file  Occupational History  . Not on file  Tobacco Use  . Smoking status: Current Every Day Smoker    Packs/day: 1.00    Types: Cigarettes  . Smokeless tobacco: Never Used  Vaping Use  . Vaping Use: Never used  Substance and Sexual Activity  . Alcohol use: Yes    Alcohol/week: 0.0 standard drinks  . Drug use: No  . Sexual activity: Yes  Other Topics Concern  . Not on file  Social History Narrative  . Not on file   Social Determinants of Health   Financial Resource Strain: Not on file  Food Insecurity: Not on file  Transportation Needs: Not on file  Physical Activity: Not on file  Stress: Not on file  Social Connections: Not on file  Intimate Partner Violence: Not on file    Outpatient Medications Prior to Visit  Medication Sig Dispense Refill  . losartan (COZAAR) 50 MG tablet Take 1 tablet (50 mg total) by mouth daily for 90 doses. 90 tablet 0   No facility-administered medications prior to visit.    No Known Allergies  Review  of Systems  Constitutional: Negative for fever and malaise/fatigue.  HENT: Positive for congestion, sinus pain and sore throat.   Eyes: Negative for blurred vision.  Respiratory: Positive for cough. Negative for shortness of breath.   Cardiovascular: Negative for chest pain, palpitations and leg swelling.  Gastrointestinal: Negative for vomiting.  Musculoskeletal: Negative for back pain.  Skin: Negative for rash.  Neurological: Negative for loss of consciousness and headaches.       Objective:    Physical Exam Vitals and nursing note reviewed.  Constitutional:      General: He is not in acute distress.    Appearance: He is not ill-appearing or toxic-appearing.   Neurological:     Mental Status: He is alert.  Psychiatric:        Mood and Affect: Mood normal.        Behavior: Behavior normal.     There were no vitals taken for this visit. Wt Readings from Last 3 Encounters:  09/30/20 210 lb (95.3 kg)  08/21/20 216 lb (98 kg)  05/27/15 194 lb (88 kg)    Diabetic Foot Exam - Simple   No data filed    Lab Results  Component Value Date   WBC 6.5 08/21/2020   HGB 14.6 08/21/2020   HCT 42.7 08/21/2020   PLT 223.0 08/21/2020   GLUCOSE 103 (H) 09/30/2020   CHOL 176 08/21/2020   TRIG 158.0 (H) 08/21/2020   HDL 40.20 08/21/2020   LDLCALC 104 (H) 08/21/2020   ALT 12 08/21/2020   AST 12 08/21/2020   NA 138 09/30/2020   K 3.8 09/30/2020   CL 103 09/30/2020   CREATININE 1.15 09/30/2020   BUN 11 09/30/2020   CO2 25 09/30/2020    No results found for: TSH Lab Results  Component Value Date   WBC 6.5 08/21/2020   HGB 14.6 08/21/2020   HCT 42.7 08/21/2020   MCV 85.8 08/21/2020   PLT 223.0 08/21/2020   Lab Results  Component Value Date   NA 138 09/30/2020   K 3.8 09/30/2020   CO2 25 09/30/2020   GLUCOSE 103 (H) 09/30/2020   BUN 11 09/30/2020   CREATININE 1.15 09/30/2020   BILITOT 0.6 08/21/2020   ALKPHOS 68 08/21/2020   AST 12 08/21/2020   ALT 12 08/21/2020   PROT 6.9 08/21/2020   ALBUMIN 4.4 08/21/2020   CALCIUM 9.2 09/30/2020   ANIONGAP 10 09/30/2020   GFR 73.62 08/21/2020   Lab Results  Component Value Date   CHOL 176 08/21/2020   Lab Results  Component Value Date   HDL 40.20 08/21/2020   Lab Results  Component Value Date   LDLCALC 104 (H) 08/21/2020   Lab Results  Component Value Date   TRIG 158.0 (H) 08/21/2020   Lab Results  Component Value Date   CHOLHDL 4 08/21/2020   No results found for: HGBA1C     Assessment & Plan:   Problem List Items Addressed This Visit      Unprioritized   Acute non-recurrent pansinusitis - Primary    abx per orderes  Pt has scheduled a covid test and will call us  back if positive or if symptoms worsen      Relevant Medications   amoxicillin-clavulanate (AUGMENTIN) 875-125 MG tablet   promethazine-dextromethorphan (PROMETHAZINE-DM) 6.25-15 MG/5ML syrup   Cough   Relevant Medications   promethazine-dextromethorphan (PROMETHAZINE-DM) 6.25-15 MG/5ML syrup      I am having Scott Watson start on amoxicillin-clavulanate and promethazine-dextromethorphan. I am also having him  maintain his losartan.  Meds ordered this encounter  Medications  . amoxicillin-clavulanate (AUGMENTIN) 875-125 MG tablet    Sig: Take 1 tablet by mouth 2 (two) times daily.    Dispense:  20 tablet    Refill:  0  . promethazine-dextromethorphan (PROMETHAZINE-DM) 6.25-15 MG/5ML syrup    Sig: Take 5 mLs by mouth 4 (four) times daily as needed.    Dispense:  118 mL    Refill:  0     I discussed the assessment and treatment plan with the patient. The patient was provided an opportunity to ask questions and all were answered. The patient agreed with the plan and demonstrated an understanding of the instructions.   The patient was advised to call back or seek an in-person evaluation if the symptoms worsen or if the condition fails to improve as anticipated.  Had to transition to phone visit after > 20 min -- more than 50% time    Donato Schultz, DO [employer] [facility] [employer] [phone] (phone) [phone] (fax)  [facility]

## 2020-10-27 ENCOUNTER — Encounter: Payer: Self-pay | Admitting: Family Medicine

## 2020-11-24 ENCOUNTER — Ambulatory Visit: Payer: 59 | Admitting: Family Medicine

## 2020-11-24 DIAGNOSIS — Z0289 Encounter for other administrative examinations: Secondary | ICD-10-CM

## 2020-11-26 ENCOUNTER — Encounter: Payer: Self-pay | Admitting: Gastroenterology

## 2020-12-10 ENCOUNTER — Encounter: Payer: Self-pay | Admitting: Physician Assistant

## 2021-01-19 ENCOUNTER — Other Ambulatory Visit: Payer: Self-pay | Admitting: Physician Assistant

## 2021-01-19 ENCOUNTER — Telehealth: Payer: Self-pay

## 2021-01-19 MED ORDER — LOSARTAN POTASSIUM 50 MG PO TABS: 50.0000 mg | ORAL_TABLET | Freq: Every day | ORAL | 0 refills | Status: DC

## 2021-01-19 NOTE — Telephone Encounter (Signed)
Rx sent to pharmacy as requested.

## 2021-01-19 NOTE — Telephone Encounter (Signed)
LAST APPOINTMENT DATE: 08/21/20   NEXT APPOINTMENT DATE: None   MEDICATION:losartan (COZAAR) 50 MG tablet (Expired)  Is the patient out of medication? Yes  PHARMACY:[facility] - [place],  - [address]

## 2021-02-23 ENCOUNTER — Ambulatory Visit (AMBULATORY_SURGERY_CENTER): Payer: 59 | Admitting: *Deleted

## 2021-02-23 ENCOUNTER — Other Ambulatory Visit: Payer: Self-pay

## 2021-02-23 VITALS — Ht 70.0 in | Wt 215.0 lb

## 2021-02-23 DIAGNOSIS — Z1211 Encounter for screening for malignant neoplasm of colon: Secondary | ICD-10-CM

## 2021-02-23 MED ORDER — PEG 3350-KCL-NA BICARB-NACL 420 G PO SOLR: 4000.0000 mL | Freq: Once | ORAL | 0 refills | Status: AC

## 2021-02-23 NOTE — Progress Notes (Signed)
Pt verified name, DOB, address and insurance during PV today.  Pt mailed instruction packet of Emmi video, copy of consent form to read and not return, and instructions  PV completed over the phone.  Pt encouraged to call with questions or issues.  My Chart instructions to pt as well    No egg or soy allergy known to patient  No issues known to pt with past sedation with any surgeries or procedures Patient denies ever being told they had issues or difficulty with intubation  No FH of Malignant Hyperthermia Pt is not on diet pills Pt is not on  home 02  Pt is not on blood thinners  Pt denies issues with constipation  No A fib or A flutter  EMMI video to pt or via MyChart  COVID 19 guidelines implemented in PV today with Pt and RN   Pt is not vaccinated  for Covid   Due to the COVID-19 pandemic we are asking patients to follow certain guidelines.  Pt aware of COVID protocols and [facility] guidelines   

## 2021-03-09 ENCOUNTER — Encounter: Payer: 59 | Admitting: Gastroenterology

## 2021-03-09 ENCOUNTER — Telehealth: Payer: Self-pay | Admitting: Gastroenterology

## 2021-03-09 NOTE — Telephone Encounter (Signed)
Hey Dr. Russella Dar,   Patient called in to cancel procedure. States he have been exposed to COVID and is going to get tested. Patient will call back to reschedule.  Thank you

## 2021-04-21 ENCOUNTER — Other Ambulatory Visit: Payer: Self-pay

## 2021-04-21 ENCOUNTER — Telehealth: Payer: Self-pay

## 2021-04-21 MED ORDER — LOSARTAN POTASSIUM 50 MG PO TABS: 50.0000 mg | ORAL_TABLET | Freq: Every day | ORAL | 1 refills | Status: DC

## 2021-04-21 NOTE — Telephone Encounter (Signed)
LAST APPOINTMENT DATE 08/21/20    NEXT APPOINTMENT DATE: none  MEDICATION:losartan (COZAAR) 50 MG tablet (Expired)  PHARMACY: [facility] - [place], [place] - [address]

## 2021-10-15 ENCOUNTER — Other Ambulatory Visit: Payer: Self-pay | Admitting: Physician Assistant

## 2021-10-16 ENCOUNTER — Telehealth: Payer: Self-pay

## 2021-10-16 MED ORDER — LOSARTAN POTASSIUM 50 MG PO TABS: 50.0000 mg | ORAL_TABLET | Freq: Every day | ORAL | 0 refills | Status: DC

## 2021-10-16 NOTE — Telephone Encounter (Signed)
Rx sent to pharmacy   

## 2021-10-19 ENCOUNTER — Encounter: Payer: Self-pay | Admitting: Physician Assistant

## 2021-10-19 ENCOUNTER — Ambulatory Visit (INDEPENDENT_AMBULATORY_CARE_PROVIDER_SITE_OTHER): Payer: 59 | Admitting: Physician Assistant

## 2021-10-19 VITALS — BP 140/82 | HR 85 | Temp 98.1°F | Ht 70.0 in | Wt 214.0 lb

## 2021-10-19 DIAGNOSIS — L918 Other hypertrophic disorders of the skin: Secondary | ICD-10-CM

## 2021-10-19 DIAGNOSIS — Z1211 Encounter for screening for malignant neoplasm of colon: Secondary | ICD-10-CM

## 2021-10-19 DIAGNOSIS — E7800 Pure hypercholesterolemia, unspecified: Secondary | ICD-10-CM | POA: Diagnosis not present

## 2021-10-19 DIAGNOSIS — Z0000 Encounter for general adult medical examination without abnormal findings: Secondary | ICD-10-CM

## 2021-10-19 DIAGNOSIS — I10 Essential (primary) hypertension: Secondary | ICD-10-CM

## 2021-10-19 DIAGNOSIS — F17210 Nicotine dependence, cigarettes, uncomplicated: Secondary | ICD-10-CM

## 2021-10-19 DIAGNOSIS — Z131 Encounter for screening for diabetes mellitus: Secondary | ICD-10-CM

## 2021-10-19 DIAGNOSIS — E291 Testicular hypofunction: Secondary | ICD-10-CM

## 2021-10-19 LAB — COMPREHENSIVE METABOLIC PANEL
ALT: 17 U/L (ref 0–53)
AST: 17 U/L (ref 0–37)
Albumin: 4.4 g/dL (ref 3.5–5.2)
Alkaline Phosphatase: 68 U/L (ref 39–117)
BUN: 12 mg/dL (ref 6–23)
CO2: 28 mEq/L (ref 19–32)
Calcium: 9.4 mg/dL (ref 8.4–10.5)
Chloride: 105 mEq/L (ref 96–112)
Creatinine, Ser: 1.11 mg/dL (ref 0.40–1.50)
GFR: 77.79 mL/min (ref >60.00)
Glucose, Bld: 99 mg/dL (ref 70–99)
Potassium: 3.8 mEq/L (ref 3.5–5.1)
Sodium: 141 mEq/L (ref 135–145)
Total Bilirubin: 0.7 mg/dL (ref 0.2–1.2)
Total Protein: 7.4 g/dL (ref 6.0–8.3)

## 2021-10-19 LAB — CBC WITH DIFFERENTIAL/PLATELET
Basophils Absolute: 0 10*3/uL (ref 0.0–0.1)
Basophils Relative: 0.4 % (ref 0.0–3.0)
Eosinophils Absolute: 0.2 10*3/uL (ref 0.0–0.7)
Eosinophils Relative: 3.2 % (ref 0.0–5.0)
HCT: 41.5 % (ref 39.0–52.0)
Hemoglobin: 13.9 g/dL (ref 13.0–17.0)
Lymphocytes Relative: 26 % (ref 12.0–46.0)
Lymphs Abs: 1.6 10*3/uL (ref 0.7–4.0)
MCHC: 33.4 g/dL (ref 30.0–36.0)
MCV: 85.5 fl (ref 78.0–100.0)
Monocytes Absolute: 0.6 10*3/uL (ref 0.1–1.0)
Monocytes Relative: 9.9 % (ref 3.0–12.0)
Neutro Abs: 3.7 10*3/uL (ref 1.4–7.7)
Neutrophils Relative %: 60.5 % (ref 43.0–77.0)
Platelets: 216 10*3/uL (ref 150.0–400.0)
RBC: 4.86 Mil/uL (ref 4.22–5.81)
RDW: 14.3 % (ref 11.5–15.5)
WBC: 6.1 10*3/uL (ref 4.0–10.5)

## 2021-10-19 LAB — LIPID PANEL
Cholesterol: 164 mg/dL (ref 0–200)
HDL: 39 mg/dL — ABNORMAL LOW (ref >39.00)
LDL Cholesterol: 97 mg/dL (ref 0–99)
NonHDL: 125.38
Total CHOL/HDL Ratio: 4
Triglycerides: 141 mg/dL (ref 0.0–149.0)
VLDL: 28.2 mg/dL (ref 0.0–40.0)

## 2021-10-19 LAB — HEMOGLOBIN A1C: Hgb A1c MFr Bld: 5.9 % (ref 4.6–6.5)

## 2021-10-19 NOTE — Progress Notes (Signed)
? ?Subjective:  ? ? Patient ID: Scott Watson, male    DOB: 12/25/1970, 51 y.o.   MRN: [id number] ? ?Chief Complaint  ?Patient presents with  ? Annual Exam  ? Hypertension  ? ? ?HPI ?Patient is in today for annual exam. Fasting today.  ? ?Acute concerns: ?Needing referral for screening colonoscopy. ?Some sinus /allergies flared up right now in the spring - Claritin D helps ? ?Health maintenance: ?Lifestyle/ exercise: Trying to work on being more active  ?Nutrition: No sodas, eats mostly baked foods, tries to avoid pork & red meats ?Mental health: Doing ok ?Caffeine: Green Tea Verdis Frederickson), rare coffee every few months  ?Sleep: "OK" ?Substance use: 12 cigs/ day has previously quit a few years ago for 2 years "cold [place]" -  ?Sexual activity: Prior hx of low T - never treated. No major symptoms or concerns, but would like this checked again.  ?Immunizations: Update Tdap today  ?Colonoscopy: Never done; not high risk; didn't make it last year due to COVID  ?Prostate screening: No symptoms, no family hx  ? ?Blood pressure has been running 130s/80s at home. Taking Losartan 50 mg daily.  ? ? ?Past Medical History:  ?Diagnosis Date  ? Allergy   ? GERD (gastroesophageal reflux disease)   ? Hypertension   ? ? ?Past Surgical History:  ?Procedure Laterality Date  ? EYE SURGERY    ? as child  ? WISDOM TOOTH EXTRACTION    ? ? ?Family History  ?Problem Relation Age of Onset  ? Diabetes Mother   ? Hypertension Mother   ? Heart disease Father   ? Hypertension Father   ? Stroke Father   ? Colon cancer Neg Hx   ? Colon polyps Neg Hx   ? Esophageal cancer Neg Hx   ? Rectal cancer Neg Hx   ? Stomach cancer Neg Hx   ? ? ?Social History  ? ?Tobacco Use  ? Smoking status: Every Day  ?  Packs/day: 0.50  ?  Years: 15.00  ?  Pack years: 7.50  ?  Types: Cigarettes  ? Smokeless tobacco: Never  ?Vaping Use  ? Vaping Use: Never used  ?Substance Use Topics  ? Alcohol use: Not Currently  ?  Comment: social - beer  ? Drug use: No  ?  ? ?No Known  Allergies ? ?Review of Systems ?NEGATIVE UNLESS OTHERWISE INDICATED IN HPI ? ? ?   ?Objective:  ?  ? ?BP 140/82 (BP Location: Left Arm, Patient Position: Sitting, Cuff Size: Large)   Pulse 85   Temp 98.1 ?F (36.7 ?C) (Temporal)   Ht 5\' 10"  (1.778 m)   Wt 214 lb (97.1 kg)   SpO2 98%   BMI 30.71 kg/m?  ? ?Wt Readings from Last 3 Encounters:  ?10/19/21 214 lb (97.1 kg)  ?02/23/21 215 lb (97.5 kg)  ?09/30/20 210 lb (95.3 kg)  ? ? ?BP Readings from Last 3 Encounters:  ?10/19/21 140/82  ?09/30/20 114/76  ?08/21/20 (!) 145/96  ?  ? ?Physical Exam ?Vitals and nursing note reviewed.  ?Constitutional:   ?   General: He is not in acute distress. ?   Appearance: Normal appearance. He is not toxic-appearing.  ?HENT:  ?   Head: Normocephalic and atraumatic.  ?   Right Ear: Tympanic membrane, ear canal and external ear normal.  ?   Left Ear: Tympanic membrane, ear canal and external ear normal.  ?   Nose: Nose normal.  ?   Mouth/Throat:  ?  Mouth: Mucous membranes are moist.  ?   Pharynx: Oropharynx is clear.  ?Eyes:  ?   Extraocular Movements: Extraocular movements intact.  ?   Conjunctiva/sclera: Conjunctivae normal.  ?   Pupils: Pupils are equal, round, and reactive to light.  ?Cardiovascular:  ?   Rate and Rhythm: Normal rate and regular rhythm.  ?   Pulses: Normal pulses.  ?   Heart sounds: Normal heart sounds.  ?Pulmonary:  ?   Effort: Pulmonary effort is normal.  ?   Breath sounds: Normal breath sounds.  ?Abdominal:  ?   General: Abdomen is flat. Bowel sounds are normal.  ?   Palpations: Abdomen is soft.  ?   Tenderness: There is no abdominal tenderness.  ?Musculoskeletal:     ?   General: Normal range of motion.  ?   Cervical back: Normal range of motion and neck supple.  ?Skin: ?   General: Skin is warm and dry.  ?   Comments: 4 benign small skin tags around the neck.  ?Neurological:  ?   General: No focal deficit present.  ?   Mental Status: He is alert and oriented to person, place, and time.  ?Psychiatric:     ?    Mood and Affect: Mood normal.     ?   Behavior: Behavior normal.  ? ? ?   ?Assessment & Plan:  ? ?Problem List Items Addressed This Visit   ?None ?Visit Diagnoses   ? ? Encounter for annual physical exam    -  Primary  ? Relevant Orders  ? CBC with Differential/Platelet (Completed)  ? Comprehensive metabolic panel (Completed)  ? Lipid panel (Completed)  ? Hemoglobin A1c (Completed)  ? Diabetes mellitus screening      ? Relevant Orders  ? Comprehensive metabolic panel (Completed)  ? Hemoglobin A1c (Completed)  ? Elevated LDL cholesterol level      ? Relevant Orders  ? Lipid panel (Completed)  ? Screening for colon cancer      ? Relevant Orders  ? Ambulatory referral to Gastroenterology  ? CBC with Differential/Platelet (Completed)  ? Essential hypertension      ? Cigarette nicotine dependence without complication      ? Cutaneous skin tags      ? Hypogonadism in male      ? ?  ? ? ?PLAN: ?Age-appropriate screening and counseling performed today. Will check labs and call with results. Preventive measures discussed and printed in AVS for patient.  ?-Reschedule for fasting labs closer to 8 AM and lab first opens.  For most accurate result. ?-Highly encourage patient to quit smoking. ?-He needs to have an annual eye exam. ?-Dental exams every 6 months encouraged. ?-Referral placed for colonoscopy screening ?-Encouraged to keep working on lifestyle changes ? ?-Patient to call his insurance about coverage for skin tag removal and call back for appointment ? ?Follow-up 1 year or as needed ? ? ?This note was prepared with assistance of Conservation officer, historic buildings. Occasional wrong-word or sound-a-like substitutions may have occurred due to the inherent limitations of voice recognition software. ? ? ?Argyle Gustafson M Kyri Dai, PA-C ?

## 2021-10-19 NOTE — Patient Instructions (Addendum)
Great to see you today! ? ?Schedule for fasting Serum Testosterone level with our lab. ?Schedule for f/up CPE in one year. ? ?*You will need to completely quit smoking prior to any T-replacement therapy. ? ?You need to schedule for an eye exam annually due to history of high blood pressure.  ?*Call insurance and see what local providers are covered."  ? ?Please check with your insurance about coverage for "skin tag removals via cryotherapy."  ? ?Please call [facility] to schedule your colonoscopy. [phone] ? ?

## 2021-11-02 ENCOUNTER — Other Ambulatory Visit (INDEPENDENT_AMBULATORY_CARE_PROVIDER_SITE_OTHER): Payer: 59

## 2021-11-02 DIAGNOSIS — E291 Testicular hypofunction: Secondary | ICD-10-CM | POA: Diagnosis not present

## 2021-11-02 LAB — TESTOSTERONE: Testosterone: 412.24 ng/dL (ref 300.00–890.00)

## 2021-11-18 ENCOUNTER — Telehealth: Payer: Self-pay | Admitting: Physician Assistant

## 2021-11-18 NOTE — Telephone Encounter (Signed)
  Encourage patient to contact the pharmacy for refills or they can request refills through [place]:  10/19/21  NEXT APPOINTMENT DATE: None  MEDICATION: Losartan 50 mg daily  Is the patient out of medication? Has one left for tomorrow  PHARMACY: [place], [place].  [address] [place] [address]  Phone:  [phone]  Fax:  [phone]  DEA #:  --  Pt would also like to know if a 90 day supply could be given rather than 30 day supply due to being on the road often  Let patient know to contact pharmacy at the end of the day to make sure medication is ready.  Please notify patient to allow 48-72 hours to process

## 2021-11-20 ENCOUNTER — Other Ambulatory Visit: Payer: Self-pay

## 2021-11-20 MED ORDER — LOSARTAN POTASSIUM 50 MG PO TABS
50.0000 mg | ORAL_TABLET | Freq: Every day | ORAL | 0 refills | Status: DC
Start: 2021-11-20 — End: 2022-02-17

## 2021-11-20 NOTE — Telephone Encounter (Signed)
New 90 day Rx sent to pt pharmacy that was requested.

## 2021-11-20 NOTE — Telephone Encounter (Signed)
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  States it would be easier for him to have this script on a 90 day script with refills.  States will pick up current script sent but would like script changed going forward.    Please reach out to patient in regard.

## 2022-01-15 ENCOUNTER — Encounter: Payer: Self-pay | Admitting: Physician Assistant

## 2022-02-17 ENCOUNTER — Other Ambulatory Visit: Payer: Self-pay | Admitting: Physician Assistant

## 2022-03-08 ENCOUNTER — Encounter: Payer: Self-pay | Admitting: *Deleted

## 2022-05-27 ENCOUNTER — Other Ambulatory Visit: Payer: Self-pay | Admitting: Physician Assistant

## 2022-05-27 ENCOUNTER — Encounter: Payer: Self-pay | Admitting: *Deleted

## 2022-07-06 ENCOUNTER — Ambulatory Visit: Payer: Medicaid Other | Admitting: Physician Assistant

## 2022-07-15 DIAGNOSIS — Z419 Encounter for procedure for purposes other than remedying health state, unspecified: Secondary | ICD-10-CM | POA: Diagnosis not present

## 2022-08-13 DIAGNOSIS — Z419 Encounter for procedure for purposes other than remedying health state, unspecified: Secondary | ICD-10-CM | POA: Diagnosis not present

## 2022-09-09 ENCOUNTER — Other Ambulatory Visit: Payer: Self-pay | Admitting: Physician Assistant

## 2022-09-13 DIAGNOSIS — Z419 Encounter for procedure for purposes other than remedying health state, unspecified: Secondary | ICD-10-CM | POA: Diagnosis not present

## 2022-10-13 DIAGNOSIS — Z419 Encounter for procedure for purposes other than remedying health state, unspecified: Secondary | ICD-10-CM | POA: Diagnosis not present

## 2022-11-13 DIAGNOSIS — Z419 Encounter for procedure for purposes other than remedying health state, unspecified: Secondary | ICD-10-CM | POA: Diagnosis not present

## 2022-12-06 ENCOUNTER — Other Ambulatory Visit: Payer: Self-pay | Admitting: Physician Assistant

## 2022-12-06 NOTE — Telephone Encounter (Signed)
Called and detailed vm , pt needed to make an for refill for his medication , he hasn't been seen since 05/23.

## 2022-12-07 ENCOUNTER — Encounter: Payer: Self-pay | Admitting: Physician Assistant

## 2022-12-07 ENCOUNTER — Ambulatory Visit (INDEPENDENT_AMBULATORY_CARE_PROVIDER_SITE_OTHER): Payer: Medicaid Other | Admitting: Physician Assistant

## 2022-12-07 VITALS — BP 148/80 | HR 64 | Temp 97.1°F | Ht 70.0 in | Wt 216.0 lb

## 2022-12-07 DIAGNOSIS — E7800 Pure hypercholesterolemia, unspecified: Secondary | ICD-10-CM | POA: Diagnosis not present

## 2022-12-07 DIAGNOSIS — Z1211 Encounter for screening for malignant neoplasm of colon: Secondary | ICD-10-CM | POA: Diagnosis not present

## 2022-12-07 DIAGNOSIS — I10 Essential (primary) hypertension: Secondary | ICD-10-CM

## 2022-12-07 DIAGNOSIS — Z0000 Encounter for general adult medical examination without abnormal findings: Secondary | ICD-10-CM

## 2022-12-07 DIAGNOSIS — R7303 Prediabetes: Secondary | ICD-10-CM

## 2022-12-07 DIAGNOSIS — Z125 Encounter for screening for malignant neoplasm of prostate: Secondary | ICD-10-CM

## 2022-12-07 LAB — COMPREHENSIVE METABOLIC PANEL
ALT: 17 U/L (ref 0–53)
AST: 15 U/L (ref 0–37)
Albumin: 4.3 g/dL (ref 3.5–5.2)
Alkaline Phosphatase: 80 U/L (ref 39–117)
BUN: 13 mg/dL (ref 6–23)
CO2: 28 mEq/L (ref 19–32)
Calcium: 9.6 mg/dL (ref 8.4–10.5)
Chloride: 108 mEq/L (ref 96–112)
Creatinine, Ser: 1.15 mg/dL (ref 0.40–1.50)
GFR: 73.96 mL/min (ref >60.00)
Glucose, Bld: 104 mg/dL — ABNORMAL HIGH (ref 70–99)
Potassium: 3.6 mEq/L (ref 3.5–5.1)
Sodium: 144 mEq/L (ref 135–145)
Total Bilirubin: 0.5 mg/dL (ref 0.2–1.2)
Total Protein: 6.8 g/dL (ref 6.0–8.3)

## 2022-12-07 LAB — TSH: TSH: 4.34 u[IU]/mL (ref 0.35–5.50)

## 2022-12-07 LAB — LIPID PANEL
Cholesterol: 176 mg/dL (ref 0–200)
HDL: 30.2 mg/dL — ABNORMAL LOW (ref >39.00)
NonHDL: 145.77
Total CHOL/HDL Ratio: 6
Triglycerides: 274 mg/dL — ABNORMAL HIGH (ref 0.0–149.0)
VLDL: 54.8 mg/dL — ABNORMAL HIGH (ref 0.0–40.0)

## 2022-12-07 LAB — CBC WITH DIFFERENTIAL/PLATELET
Basophils Absolute: 0 10*3/uL (ref 0.0–0.1)
Basophils Relative: 0.4 % (ref 0.0–3.0)
Eosinophils Absolute: 0.2 10*3/uL (ref 0.0–0.7)
Eosinophils Relative: 3.3 % (ref 0.0–5.0)
HCT: 42.6 % (ref 39.0–52.0)
Hemoglobin: 14.2 g/dL (ref 13.0–17.0)
Lymphocytes Relative: 34.4 % (ref 12.0–46.0)
Lymphs Abs: 2.3 10*3/uL (ref 0.7–4.0)
MCHC: 33.2 g/dL (ref 30.0–36.0)
MCV: 86.5 fl (ref 78.0–100.0)
Monocytes Absolute: 0.5 10*3/uL (ref 0.1–1.0)
Monocytes Relative: 7.6 % (ref 3.0–12.0)
Neutro Abs: 3.7 10*3/uL (ref 1.4–7.7)
Neutrophils Relative %: 54.3 % (ref 43.0–77.0)
Platelets: 237 10*3/uL (ref 150.0–400.0)
RBC: 4.93 Mil/uL (ref 4.22–5.81)
RDW: 14.4 % (ref 11.5–15.5)
WBC: 6.8 10*3/uL (ref 4.0–10.5)

## 2022-12-07 LAB — LDL CHOLESTEROL, DIRECT: Direct LDL: 87 mg/dL

## 2022-12-07 LAB — HEMOGLOBIN A1C: Hgb A1c MFr Bld: 5.8 % (ref 4.6–6.5)

## 2022-12-07 LAB — PSA: PSA: 0.83 ng/mL (ref 0.10–4.00)

## 2022-12-07 NOTE — Patient Instructions (Addendum)
Good to see you today!!  Your blood pressure is higher than I'd like to see. Goal is 130/80 or less. Please monitor at home. Work on more water, exercise; less salt / sugar. Recheck in 4 weeks with me. If still running high, need to consider change in medication.   Labs today.  Look into [facility] or [facility] for vision check.  Please call [facility] to schedule your colonoscopy. [phone]

## 2022-12-07 NOTE — Progress Notes (Signed)
Subjective:    Patient ID: Scott Watson, male    DOB: [birth year], 52 y.o.   MRN: [id number]  Chief Complaint  Patient presents with   Medication Refill    Medication Refill   Patient is in today for annual exam.  Acute concerns: Needs medication refilled  Health maintenance: Lifestyle/ exercise: Walking daily  Nutrition: Trying to work on better habits  Mental health: doing good  Caffeine: None  Sleep: Hx of not sleeping well, tosses around a lot; denies any nocturia  Substance use: Current smoker  Sexual activity: so-so per patient  Immunizations: Declines Shingles  Colonoscopy: Never done    Past Medical History:  Diagnosis Date   Allergy    GERD (gastroesophageal reflux disease)    Hypertension     Past Surgical History:  Procedure Laterality Date   EYE SURGERY     as child   WISDOM TOOTH EXTRACTION      Family History  Problem Relation Age of Onset   Diabetes Mother    Hypertension Mother    Heart disease Father    Hypertension Father    Stroke Father    Colon cancer Neg Hx    Colon polyps Neg Hx    Esophageal cancer Neg Hx    Rectal cancer Neg Hx    Stomach cancer Neg Hx     Social History   Tobacco Use   Smoking status: Every Day    Packs/day: 0.50    Years: 15.00    Additional pack years: 0.00    Total pack years: 7.50    Types: Cigarettes   Smokeless tobacco: Never  Vaping Use   Vaping Use: Never used  Substance Use Topics   Alcohol use: Not Currently    Comment: social - beer   Drug use: No     No Known Allergies  Review of Systems NEGATIVE UNLESS OTHERWISE INDICATED IN HPI      Objective:     BP (!) 148/80   Pulse 64   Temp (!) 97.1 F (36.2 C)   Ht 5\' 10"  (1.778 m)   Wt 216 lb (98 kg)   SpO2 95%   BMI 30.99 kg/m   Wt Readings from Last 3 Encounters:  12/07/22 216 lb (98 kg)  10/19/21 214 lb (97.1 kg)  02/23/21 215 lb (97.5 kg)    BP Readings from Last 3 Encounters:  12/07/22 (!) 148/80  10/19/21  140/82  09/30/20 114/76     Physical Exam Vitals and nursing note reviewed.  Constitutional:      General: He is not in acute distress.    Appearance: Normal appearance. He is not toxic-appearing.  HENT:     Head: Normocephalic and atraumatic.     Right Ear: Tympanic membrane, ear canal and external ear normal.     Left Ear: Tympanic membrane, ear canal and external ear normal.     Nose: Nose normal.     Mouth/Throat:     Mouth: Mucous membranes are moist.     Pharynx: Oropharynx is clear.  Eyes:     Extraocular Movements: Extraocular movements intact.     Conjunctiva/sclera: Conjunctivae normal.     Pupils: Pupils are equal, round, and reactive to light.  Cardiovascular:     Rate and Rhythm: Normal rate and regular rhythm.     Pulses: Normal pulses.     Heart sounds: Normal heart sounds.  Pulmonary:     Effort: Pulmonary effort is normal.  Breath sounds: Normal breath sounds.  Abdominal:     General: Abdomen is flat. Bowel sounds are normal.     Palpations: Abdomen is soft.     Tenderness: There is no abdominal tenderness.  Musculoskeletal:        General: Normal range of motion.     Cervical back: Normal range of motion and neck supple.     Right lower leg: No edema.     Left lower leg: No edema.  Skin:    General: Skin is warm and dry.     Findings: No rash.  Neurological:     General: No focal deficit present.     Mental Status: He is alert and oriented to person, place, and time.  Psychiatric:        Mood and Affect: Mood normal.        Behavior: Behavior normal.        Assessment & Plan:  Encounter for annual physical exam -     Lipid panel -     PSA -     TSH -     Comprehensive metabolic panel -     CBC with Differential/Platelet -     Hemoglobin A1c  Essential hypertension Assessment & Plan: Elevated Losartan 50 mg currently Work on lifestyle measures Track at home x 4 weeks Recheck with me, adjust if continues to be elevated  Orders: -      Comprehensive metabolic panel  Screening for colon cancer -     Ambulatory referral to Gastroenterology  Elevated LDL cholesterol level -     Lipid panel  Prediabetes -     Comprehensive metabolic panel -     Hemoglobin A1c   Age-appropriate screening and counseling performed today. Will check labs and call with results. Preventive measures discussed and printed in AVS for patient.   Patient Counseling: [x]   Nutrition: Stressed importance of moderation in sodium/caffeine intake, saturated fat and cholesterol, caloric balance, sufficient intake of fresh fruits, vegetables, and fiber.  [x]   Stressed the importance of regular exercise.   [x]   Substance Abuse: Discussed cessation/primary prevention of tobacco, alcohol, or other drug use; driving or other dangerous activities under the influence; availability of treatment for abuse.   []   Injury prevention: Discussed safety belts, safety helmets, smoke detector, smoking near bedding or upholstery.   []   Sexuality: Discussed sexually transmitted diseases, partner selection, use of condoms, avoidance of unintended pregnancy  and contraceptive alternatives.   [x]   Dental health: Discussed importance of regular tooth brushing, flossing, and dental visits.  [x]   Health maintenance and immunizations reviewed. Please refer to Health maintenance section.       Return in about 4 weeks (around 01/04/2023) for blood pressure check.   Dezirea Mccollister M Janet Humphreys, PA-C

## 2022-12-07 NOTE — Assessment & Plan Note (Signed)
Elevated Losartan 50 mg currently Work on lifestyle measures Track at home x 4 weeks Recheck with me, adjust if continues to be elevated

## 2022-12-08 MED ORDER — LOSARTAN POTASSIUM 50 MG PO TABS: ORAL_TABLET | ORAL | 1 refills | Status: DC

## 2022-12-08 NOTE — Telephone Encounter (Signed)
Patient called stating he is used to getting 90 day supply of losartan, not 30 day supply. Pt also informed me that 90 day supply is more affordable. Patient was just seen in office yesterday, 12/07/22. Please Advise. Pt does request a call back from pcp or pcp nurse.

## 2022-12-08 NOTE — Telephone Encounter (Signed)
Spoke to pt told him I sent 90 day supply to the pharmacy for him. Pt verbalized understanding.

## 2022-12-08 NOTE — Addendum Note (Signed)
Addended by: Jimmye Norman on: 12/08/2022 01:40 PM   Modules accepted: Orders

## 2022-12-13 DIAGNOSIS — Z419 Encounter for procedure for purposes other than remedying health state, unspecified: Secondary | ICD-10-CM | POA: Diagnosis not present

## 2023-01-04 ENCOUNTER — Ambulatory Visit: Payer: Medicaid Other | Admitting: Physician Assistant

## 2023-01-13 DIAGNOSIS — Z419 Encounter for procedure for purposes other than remedying health state, unspecified: Secondary | ICD-10-CM | POA: Diagnosis not present

## 2023-02-13 DIAGNOSIS — Z419 Encounter for procedure for purposes other than remedying health state, unspecified: Secondary | ICD-10-CM | POA: Diagnosis not present

## 2023-03-15 DIAGNOSIS — Z419 Encounter for procedure for purposes other than remedying health state, unspecified: Secondary | ICD-10-CM | POA: Diagnosis not present

## 2023-03-30 ENCOUNTER — Ambulatory Visit (INDEPENDENT_AMBULATORY_CARE_PROVIDER_SITE_OTHER): Payer: Medicaid Other | Admitting: Family Medicine

## 2023-03-30 ENCOUNTER — Encounter: Payer: Self-pay | Admitting: Family Medicine

## 2023-03-30 VITALS — BP 138/82 | HR 88 | Temp 97.8°F | Ht 70.0 in | Wt 212.0 lb

## 2023-03-30 DIAGNOSIS — Z1211 Encounter for screening for malignant neoplasm of colon: Secondary | ICD-10-CM

## 2023-03-30 DIAGNOSIS — J301 Allergic rhinitis due to pollen: Secondary | ICD-10-CM | POA: Diagnosis not present

## 2023-03-30 MED ORDER — AZELASTINE HCL 0.1 % NA SOLN: 1.0000 | Freq: Two times a day (BID) | NASAL | 5 refills | Status: AC

## 2023-03-30 NOTE — Patient Instructions (Signed)
Please follow up if symptoms do not improve or as needed.    Use zyrtec nightly and flonase twice a day for two weeks, then daily.  You may use the astelin daily for the next several weeks as needed for nasal congestion.   Please call [facility] to get scheduled for your colonoscopy.  [phone]  Allergic Rhinitis, Adult  Allergic rhinitis is an allergic reaction that affects the mucous membrane inside the nose. The mucous membrane is the tissue that produces mucus. There are two types of allergic rhinitis: Seasonal. This type is also called hay fever and happens only during certain seasons. Perennial. This type can happen at any time of the year. Allergic rhinitis cannot be spread from person to person. This condition can be mild, bad, or very bad. It can develop at any age and may be outgrown. What are the causes? This condition is caused by allergens. These are things that can cause an allergic reaction. Allergens may differ for seasonal allergic rhinitis and perennial allergic rhinitis. Seasonal allergic rhinitis is caused by pollen. Pollen can come from grasses, trees, and weeds. Perennial allergic rhinitis may be caused by: Dust mites. Proteins in a pet's pee (urine), saliva, or dander. Dander is dead skin cells from a pet. Smoke, mold, or car fumes. Remains of or waste from insects such as cockroaches. What increases the risk? You are more likely to develop this condition if you have a family history of allergies or other conditions related to allergies, including: Allergic conjunctivitis. This is irritation and swelling of parts of the eyes and eyelids. Asthma. This condition affects the lungs and makes it hard to breathe. Atopic dermatitis or eczema. This is long term (chronic) irritation and swelling of the skin. Food allergies. What are the signs or symptoms? Symptoms of this condition include: Sneezing or coughing. A stuffy nose (nasal congestion), itchy  nose, or nasal discharge. Itchy eyes and tearing of the eyes. A feeling of mucus dripping down the back of your throat (postnasal drip). This may cause a sore throat. Trouble sleeping. Tiredness. Headache. How is this diagnosed? This condition may be diagnosed with your symptoms, your medical history, and a physical exam. Your health care provider may check for related conditions, such as: Asthma. Pink eye. This is eye swelling and irritation caused by infection (conjunctivitis). Ear infection. Upper respiratory infection. This is an infection in the nose, throat, or upper airways. You may also have tests to find out which allergens cause your symptoms. These may include skin tests or blood tests. How is this treated? There is no cure for this condition, but treatment can help control symptoms. Treatment may include: Taking medicines that block allergy symptoms, such as corticosteroids (anti-inflammatories) and antihistamines. Medicine may be given as a shot, nasal spray, or pill. Avoiding any allergens. Being exposed again and again to tiny amounts of allergens to help you build a defense against allergens (allergenimmunotherapy). This is done if other treatments have not helped. It may include: Allergy shots. These are injected medicines that have small amounts of an allergen in them. Sublingual immunotherapy. This involves taking small doses of a medicine with an allergen in it under your tongue. If these treatments do not work, your provider may prescribe newer, stronger medicines. Follow these instructions at home: Avoiding allergens Find out what you are allergic to and avoid those allergens. These are some things you can do to help avoid allergens: If you have perennial allergies: Replace carpet with wood, tile, or vinyl  flooring. Carpet can trap dander and dust. Do not smoke. Do not allow smoking in your home Change your heating and air conditioning filters at least once a  month. If you have seasonal allergies, take these steps during allergy season: Keep windows closed as much as possible. Plan outdoor activities when pollen counts are lowest. Check pollen counts before you plan outdoor activities When coming indoors, change clothing and shower before sitting on furniture or bedding. If you have a pet in the house that produces allergens: Keep the pet out of the bedroom. Vacuum, sweep, and dust regularly. General instructions Take over-the-counter and prescription medicines only as told by your provider. Drink enough fluid to keep your pee pale yellow. Where to find more information American Academy of Allergy, Asthma & Immunology: aaaai.org Contact a health care provider if: You have a fever. You develop a cough that does not go away. You make high-pitched whistling sounds when you breathe, most often when you breathe out (wheeze). Your symptoms slow you down or stop you from doing your normal activities each day. Get help right away if: You have shortness of breath. This symptom may be an emergency. Get help right away. Call 911. Do not wait to see if the symptoms will go away. Do not drive yourself to the hospital. This information is not intended to replace advice given to you by your health care provider. Make sure you discuss any questions you have with your health care provider. Document Revised: 02/08/2022 Document Reviewed: 02/08/2022 Elsevier Patient Education  2024 [employer].

## 2023-03-30 NOTE — Progress Notes (Signed)
Subjective  CC:  Chief Complaint  Patient presents with   Nasal Congestion    Same day acute visit; PCP not available. New pt to me. Chart reviewed.  HPI: Scott Watson is a 52 y.o. male who presents to the office today to address the problems listed above in the chief complaint. 52 with nasal congestion, intermittent sneezing and PND x several days. No f/c/s, ST, cough or malaise. Restarted zyrtec a few nights ago and has flonase at home.  Due crc screen; never called for appt, referral placed in June.  HM: eligible for shingrix   Assessment  1. Seasonal allergic rhinitis due to pollen   2. Screening for colorectal cancer      Plan  SAR:  educated. Flonase bid x 2 weeks, zyrtec nightly and add astelin for sx control See AVS for [facility] phone number.  Educated on shingrix. Pt to schedule nurse visit when ready  Follow up: prn Visit date not found  No orders of the defined types were placed in this encounter.  Meds ordered this encounter  Medications   azelastine (ASTELIN) 0.1 % nasal spray    Sig: Place 1 spray into both nostrils 2 (two) times daily.    Dispense:  30 mL    Refill:  5      I reviewed the patients updated PMH, FH, and SocHx.    Patient Active Problem List   Diagnosis Date Noted   Encounter for annual physical exam 12/07/2022   Acute non-recurrent pansinusitis 10/24/2020   Cough 10/24/2020   Left shoulder pain 08/22/2014   Essential hypertension 02/02/2013   Current Meds  Medication Sig   Adapalene-Benzoyl Peroxide 0.3-2.5 % GEL    azelastine (ASTELIN) 0.1 % nasal spray Place 1 spray into both nostrils 2 (two) times daily.   loratadine-pseudoephedrine (CLARITIN-D 24-HOUR) 10-240 MG 24 hr tablet Take 1 tablet by mouth daily.   losartan (COZAAR) 50 MG tablet TAKE 1 TABLET BY MOUTH ONCE DAILY .    Allergies: Patient has No Known Allergies. Family History: Patient family history includes Diabetes in his mother; Heart disease in his father;  Hypertension in his father and mother; Stroke in his father. Social History:  Patient  reports that he has been smoking cigarettes. He has a 7.5 pack-year smoking history. He has never used smokeless tobacco. He reports that he does not currently use alcohol. He reports that he does not use drugs.  Review of Systems: Constitutional: Negative for fever malaise or anorexia Cardiovascular: negative for chest pain Respiratory: negative for SOB or persistent cough Gastrointestinal: negative for abdominal pain  Objective  Vitals: BP 138/82   Pulse 88   Temp 97.8 F (36.6 C)   Ht 5\' 10"  (1.778 m)   Wt 212 lb (96.2 kg)   SpO2 98%   BMI 30.42 kg/m  General: no acute distress , A&Ox3 HEENT: PEERL, conjunctiva normal, neck is supple, nasal mucosa is erythematous with clear discharge, no sinus ttp, nl TMS and oropharynx w/o cervical LAD Cardiovascular:  RRR without murmur or gallop.  Respiratory:  Good breath sounds bilaterally, CTAB with normal respiratory effort  Commons side effects, risks, benefits, and alternatives for medications and treatment plan prescribed today were discussed, and the patient expressed understanding of the given instructions. Patient is instructed to call or message via MyChart if he/she has any questions or concerns regarding our treatment plan. No barriers to understanding were identified. We discussed Red Flag symptoms and signs in detail. Patient expressed understanding  regarding what to do in case of urgent or emergency type symptoms.  Medication list was reconciled, printed and provided to the patient in AVS. Patient instructions and summary information was reviewed with the patient as documented in the AVS. This note was prepared with assistance of Dragon voice recognition software. Occasional wrong-word or sound-a-like substitutions may have occurred due to the inherent limitations of voice recognition software

## 2023-04-15 DIAGNOSIS — Z419 Encounter for procedure for purposes other than remedying health state, unspecified: Secondary | ICD-10-CM | POA: Diagnosis not present

## 2023-04-26 DIAGNOSIS — M67432 Ganglion, left wrist: Secondary | ICD-10-CM | POA: Diagnosis not present

## 2023-05-05 ENCOUNTER — Ambulatory Visit: Payer: Medicaid Other | Admitting: Physician Assistant

## 2023-05-15 DIAGNOSIS — Z419 Encounter for procedure for purposes other than remedying health state, unspecified: Secondary | ICD-10-CM | POA: Diagnosis not present

## 2023-05-18 ENCOUNTER — Encounter: Payer: Self-pay | Admitting: Physician Assistant

## 2023-05-18 ENCOUNTER — Ambulatory Visit (INDEPENDENT_AMBULATORY_CARE_PROVIDER_SITE_OTHER): Payer: No Typology Code available for payment source | Admitting: Physician Assistant

## 2023-05-18 VITALS — BP 134/82 | HR 99 | Temp 98.3°F | Ht 70.0 in | Wt 209.2 lb

## 2023-05-18 DIAGNOSIS — Z8249 Family history of ischemic heart disease and other diseases of the circulatory system: Secondary | ICD-10-CM | POA: Diagnosis not present

## 2023-05-18 DIAGNOSIS — I10 Essential (primary) hypertension: Secondary | ICD-10-CM | POA: Diagnosis not present

## 2023-05-18 DIAGNOSIS — F17200 Nicotine dependence, unspecified, uncomplicated: Secondary | ICD-10-CM

## 2023-05-18 DIAGNOSIS — E559 Vitamin D deficiency, unspecified: Secondary | ICD-10-CM | POA: Insufficient documentation

## 2023-05-18 DIAGNOSIS — L700 Acne vulgaris: Secondary | ICD-10-CM

## 2023-05-18 MED ORDER — ADAPALENE-BENZOYL PEROXIDE 0.1-2.5 % EX GEL: 1.0000 | Freq: Every day | CUTANEOUS | 5 refills | Status: DC

## 2023-05-18 NOTE — Patient Instructions (Addendum)
Your appointment for Colonoscopy procedure is scheduled for July 12, 2023. Check in is at 9:30 am. You will also have a phone visit with a nurse prior to procedure and this is scheduled for June 23, 2023 at 12:30 pm.   [facility]   Located in: [facility] [address] Address: [address], [place] [address] Hours:  Wednesday 8?AM-5?PM Thursday 8?AM-5?PM Friday 8?AM-5?PM Saturday Closed Sunday Closed Monday 8?AM-5?PM Tuesday 8?AM-5?PM   Phone: [phone]

## 2023-05-18 NOTE — Progress Notes (Signed)
Patient ID: Scott Watson, male    DOB: Oct 30, 1970, 52 y.o.   MRN: [id number]   Assessment & Plan:  Essential hypertension -     Ambulatory referral to Cardiology  Family history of heart disease -     Ambulatory referral to Cardiology  Smoking -     Ambulatory referral to Cardiology  Acne vulgaris -     Adapalene-Benzoyl Peroxide; Apply 1 Application topically at bedtime.  Dispense: 45 g; Refill: 5    Assessment and Plan    Dermatological Concerns History of acne and occasional breakouts, previously managed with Epiduo. No current significant issues reported. -Prescribe Epiduo gel, check insurance coverage and adjust prescription if necessary.  Hypertension Blood pressure slightly elevated during visit, patient reports occasional high readings at home. Currently on Losartan. -Continue Losartan, monitor blood pressure at home.  Tobacco Use Patient reports smoking approximately one pack of cigarettes per week. -Encourage smoking cessation for cardiovascular health.  Family History of Heart Disease Patient expresses concern due to family history of heart disease and recent family deaths. -Refer to cardiology for evaluation and peace of mind.  Colonoscopy Due for screening. -Schedule colonoscopy for early next year.  General Health Maintenance -Refer to dentist and optometrist accepting Medicaid. -Continue healthy lifestyle habits including good nutrition and exercise.          Return if symptoms worsen or fail to improve.    Subjective:    Chief Complaint  Patient presents with   Referral    Pt in office for referral to Dermatology; patient says he has break outs when he cuts his hair but he's always had issues with acne on his face    HPI  Discussed the use of AI scribe software for clinical note transcription with the patient, who gave verbal consent to proceed.  History of Present Illness   The patient, with a history of sensitive skin and  acne, presents requesting a referral to a dermatologist. He previously used Epiduo gel, which he found effective, but has run out of refills. He also mentions occasional breakouts after shaving.  In addition, the patient reports a history of high blood pressure, which he monitors at home. He notes that his blood pressure tends to rise with activity, but settles when he rests. He is currently taking losartan daily for this condition.  The patient expresses concern about his overall health, particularly in light of recent family deaths due to cancer and blood clots. He mentions a family history of diabetes and high blood pressure, but no known heart disease. He expresses a proactive attitude towards his health and a desire for preventative care, including a colonoscopy and a visit to a cardiologist.  The patient also mentions the need to find a new dentist and eye doctor, as his previous providers no longer accept his Medicaid insurance.       Past Medical History:  Diagnosis Date   Allergy    GERD (gastroesophageal reflux disease)    Hypertension     Past Surgical History:  Procedure Laterality Date   EYE SURGERY     as child   WISDOM TOOTH EXTRACTION      Family History  Problem Relation Age of Onset   Diabetes Mother    Hypertension Mother    Heart disease Father    Hypertension Father    Stroke Father    Colon cancer Neg Hx    Colon polyps Neg Hx    Esophageal cancer Neg Hx  Rectal cancer Neg Hx    Stomach cancer Neg Hx     Social History   Tobacco Use   Smoking status: Every Day    Current packs/day: 0.50    Average packs/day: 0.5 packs/day for 15.0 years (7.5 ttl pk-yrs)    Types: Cigarettes   Smokeless tobacco: Never  Vaping Use   Vaping status: Never Used  Substance Use Topics   Alcohol use: Not Currently    Comment: social - beer   Drug use: No     No Known Allergies  Review of Systems NEGATIVE UNLESS OTHERWISE INDICATED IN HPI      Objective:      BP 134/82 (BP Location: Right Arm, Patient Position: Sitting)   Pulse 99   Temp 98.3 F (36.8 C) (Temporal)   Ht 5\' 10"  (1.778 m)   Wt 209 lb 3.2 oz (94.9 kg)   SpO2 100%   BMI 30.02 kg/m   Wt Readings from Last 3 Encounters:  05/18/23 209 lb 3.2 oz (94.9 kg)  03/30/23 212 lb (96.2 kg)  12/07/22 216 lb (98 kg)    BP Readings from Last 3 Encounters:  05/18/23 134/82  03/30/23 138/82  12/07/22 (!) 148/80     Physical Exam Vitals and nursing note reviewed.  Constitutional:      General: He is not in acute distress.    Appearance: Normal appearance. He is not ill-appearing.  HENT:     Head: Normocephalic.     Right Ear: External ear normal.     Left Ear: External ear normal.     Nose: No congestion.     Mouth/Throat:     Mouth: Mucous membranes are moist.     Pharynx: No oropharyngeal exudate or posterior oropharyngeal erythema.  Eyes:     Extraocular Movements: Extraocular movements intact.     Conjunctiva/sclera: Conjunctivae normal.     Pupils: Pupils are equal, round, and reactive to light.  Cardiovascular:     Rate and Rhythm: Normal rate and regular rhythm.     Pulses: Normal pulses.     Heart sounds: Normal heart sounds. No murmur heard. Pulmonary:     Effort: Pulmonary effort is normal. No respiratory distress.     Breath sounds: Normal breath sounds. No wheezing.  Musculoskeletal:     Cervical back: Normal range of motion.  Skin:    General: Skin is warm.     Findings: No lesion or rash.  Neurological:     Mental Status: He is alert and oriented to person, place, and time.  Psychiatric:        Mood and Affect: Mood normal.        Behavior: Behavior normal.         Lilyan Prete M Missael Ferrari, PA-C

## 2023-06-15 DIAGNOSIS — Z419 Encounter for procedure for purposes other than remedying health state, unspecified: Secondary | ICD-10-CM | POA: Diagnosis not present

## 2023-06-23 ENCOUNTER — Ambulatory Visit (AMBULATORY_SURGERY_CENTER): Payer: Medicaid Other

## 2023-06-23 VITALS — Ht 70.0 in | Wt 205.0 lb

## 2023-06-23 DIAGNOSIS — Z1211 Encounter for screening for malignant neoplasm of colon: Secondary | ICD-10-CM

## 2023-06-23 MED ORDER — PEG 3350-KCL-NA BICARB-NACL 420 G PO SOLR: 4000.0000 mL | Freq: Once | ORAL | 0 refills | Status: AC

## 2023-06-23 NOTE — Progress Notes (Signed)

## 2023-07-05 DIAGNOSIS — M25532 Pain in left wrist: Secondary | ICD-10-CM | POA: Diagnosis not present

## 2023-07-05 DIAGNOSIS — M654 Radial styloid tenosynovitis [de Quervain]: Secondary | ICD-10-CM | POA: Diagnosis not present

## 2023-07-08 ENCOUNTER — Other Ambulatory Visit: Payer: Self-pay | Admitting: Physician Assistant

## 2023-07-08 ENCOUNTER — Other Ambulatory Visit: Payer: Self-pay

## 2023-07-08 ENCOUNTER — Telehealth: Payer: Self-pay | Admitting: Physician Assistant

## 2023-07-08 MED ORDER — LOSARTAN POTASSIUM 50 MG PO TABS: ORAL_TABLET | ORAL | 0 refills | Status: DC

## 2023-07-08 NOTE — Telephone Encounter (Signed)
Patient Rx was sent in this morning and can be seen by viewing Medications, nothing further needed at this time

## 2023-07-08 NOTE — Telephone Encounter (Unsigned)
Copied from CRM [id number]. Topic: Clinical - Medication Question >> Jul 08, 2023  9:18 AM Leavy Cella D wrote: Reason for CRM: Patient needs a refill for losartan (COZAAR) 50 MG tablet but per medication instructions patient must make an appointment to have meds refilled. Patient stated he was just in December 4 to see Dr.Allwardt. Patient would like to know if he can get meds refilled without making a appointment.

## 2023-07-12 ENCOUNTER — Encounter: Payer: Medicaid Other | Admitting: Gastroenterology

## 2023-07-12 NOTE — Telephone Encounter (Signed)
Called pt and advised after we messaged on Jan 24, a new Rx was sent to pharmacy for 90 day supply. Called Pharmacy and was advised by Duwayne Heck that they have a 90 day prescription and not sure why this wasn't filled correctly. Called pt back and gave him information from pharmacy and pharmacist name to speak with about getting his medication corrected. Per Pharmacy pt picked up 30 days supply and insurance will not pay for 90 day supply until 07/31/23.

## 2023-07-12 NOTE — Telephone Encounter (Unsigned)
Copied from CRM [id number]. Topic: Clinical - Prescription Issue >> Jul 12, 2023  2:59 PM Denese Killings wrote: Reason for CRM: Patient stated that he use to get 90 pills for $4 and now he is getting 32 for $4. Patient stated that he is suppose to be getting 90 and everytime he request for a refill he has to make an appointment. Patient stated that his doctor advised him that it shouldn't be that way and he wants it changed. He said that Pharmacy advised him that 32 was sent in instead of 90.  He is also requesting a callback.

## 2023-07-16 DIAGNOSIS — Z419 Encounter for procedure for purposes other than remedying health state, unspecified: Secondary | ICD-10-CM | POA: Diagnosis not present

## 2023-07-19 ENCOUNTER — Encounter: Payer: Self-pay | Admitting: Physician Assistant

## 2023-07-19 ENCOUNTER — Ambulatory Visit: Payer: Medicaid Other | Admitting: Physician Assistant

## 2023-07-19 VITALS — BP 120/78 | HR 86 | Temp 97.3°F | Ht 70.0 in | Wt 206.8 lb

## 2023-07-19 DIAGNOSIS — R6889 Other general symptoms and signs: Secondary | ICD-10-CM

## 2023-07-19 DIAGNOSIS — Z20828 Contact with and (suspected) exposure to other viral communicable diseases: Secondary | ICD-10-CM | POA: Diagnosis not present

## 2023-07-19 LAB — POCT INFLUENZA A/B
Influenza A, POC: NEGATIVE
Influenza B, POC: NEGATIVE

## 2023-07-19 LAB — POC COVID19 BINAXNOW: SARS Coronavirus 2 Ag: NEGATIVE

## 2023-07-19 NOTE — Progress Notes (Signed)
 Patient ID: Scott Watson, male    DOB: Dec 20, 1970, 53 y.o.   MRN: [id number]   Assessment & Plan:  Exposure to the flu -     POCT Influenza A/B -     POC COVID-19 BinaxNow  Flu-like symptoms -     POCT Influenza A/B -     POC COVID-19 BinaxNow   Assessment and Plan    Influenza-like illness Symptoms consistent with influenza, including headache, sweating, and exposure to confirmed influenza A case. Rapid influenza test negative, but clinical suspicion remains high given symptoms and exposure. -Advised supportive care with over-the-counter medications such as Tylenol  and hydration. -Encouraged to rest but also to stay active to prevent chest congestion.      No follow-ups on file.    Subjective:    Chief Complaint  Patient presents with   Influenza    Pt in the office states son was diagnosed with Flu yesterday; pt symptoms started Sunday night with night sweats, headache; and woke up this morning same way; cough started today;     Influenza   Discussed the use of AI scribe software for clinical note transcription with the patient, who gave verbal consent to proceed.  History of Present Illness   The patient, with a history of wrist issues, presents with flu-like symptoms that started two nights ago. He reports feeling 'terrible' and 'achy,' but denies having a fever. He has been in close contact with his son, who was recently diagnosed with flu A. The patient has never had the flu before and does not receive flu shots. He describes his symptoms as worse than a typical cold and has been managing with over-the-counter medications like Tylenol , but reports that these have not been very effective. He also notes that he has been sweating profusely at night and has a headache that worsens when he lies on his side. He has not been eating much and has been experiencing some dizziness and a sensation of his ears 'popping,' similar to the feeling when on an airplane.  In  addition to his flu-like symptoms, the patient also reports ongoing issues with his wrist, which has been swelling and causing him significant discomfort. He has previously had surgery for a ganglion cyst in the same area and is scheduled to see a specialist for further evaluation.       Past Medical History:  Diagnosis Date   Allergy    GERD (gastroesophageal reflux disease)    Hypertension     Past Surgical History:  Procedure Laterality Date   EYE SURGERY     as child   WISDOM TOOTH EXTRACTION      Family History  Problem Relation Age of Onset   Diabetes Mother    Hypertension Mother    Heart disease Father    Hypertension Father    Stroke Father    Colon cancer Neg Hx    Colon polyps Neg Hx    Esophageal cancer Neg Hx    Rectal cancer Neg Hx    Stomach cancer Neg Hx     Social History   Tobacco Use   Smoking status: Every Day    Current packs/day: 0.50    Average packs/day: 0.5 packs/day for 15.0 years (7.5 ttl pk-yrs)    Types: Cigarettes   Smokeless tobacco: Never  Vaping Use   Vaping status: Never Used  Substance Use Topics   Alcohol use: Not Currently   Drug use: No     No Known  Allergies  Review of Systems NEGATIVE UNLESS OTHERWISE INDICATED IN HPI      Objective:     BP 120/78 (BP Location: Left Arm, Patient Position: Sitting, Cuff Size: Normal)   Pulse 86   Temp (!) 97.3 F (36.3 C) (Temporal)   Ht 5' 10 (1.778 m)   Wt 206 lb 12.8 oz (93.8 kg)   SpO2 98%   BMI 29.67 kg/m   Wt Readings from Last 3 Encounters:  07/19/23 206 lb 12.8 oz (93.8 kg)  06/23/23 205 lb (93 kg)  05/18/23 209 lb 3.2 oz (94.9 kg)    BP Readings from Last 3 Encounters:  07/19/23 120/78  05/18/23 134/82  03/30/23 138/82     Physical Exam Vitals and nursing note reviewed.  Constitutional:      General: He is not in acute distress.    Appearance: Normal appearance. He is not ill-appearing.  HENT:     Head: Normocephalic.     Right Ear: Tympanic  membrane, ear canal and external ear normal.     Left Ear: Tympanic membrane, ear canal and external ear normal.     Nose: Congestion present.     Mouth/Throat:     Mouth: Mucous membranes are moist.     Pharynx: No oropharyngeal exudate or posterior oropharyngeal erythema.  Eyes:     Extraocular Movements: Extraocular movements intact.     Conjunctiva/sclera: Conjunctivae normal.     Pupils: Pupils are equal, round, and reactive to light.  Cardiovascular:     Rate and Rhythm: Normal rate and regular rhythm.     Pulses: Normal pulses.     Heart sounds: Normal heart sounds. No murmur heard. Pulmonary:     Effort: Pulmonary effort is normal. No respiratory distress.     Breath sounds: Normal breath sounds. No wheezing.  Musculoskeletal:     Cervical back: Normal range of motion.  Skin:    General: Skin is warm.  Neurological:     Mental Status: He is alert and oriented to person, place, and time.  Psychiatric:        Mood and Affect: Mood normal.        Behavior: Behavior normal.        Melinna Linarez M Maymunah Stegemann, PA-C

## 2023-07-20 ENCOUNTER — Ambulatory Visit: Payer: Self-pay | Admitting: Physician Assistant

## 2023-07-20 NOTE — Telephone Encounter (Signed)
 Please see triage note and advise, pt neg flu and covid 07/18/24; son tested positive flu A

## 2023-07-20 NOTE — Telephone Encounter (Signed)
 Chief Complaint: headache, sweating Symptoms: wakes up in the morning with headache and sweating Frequency: x 3 days Pertinent Negatives: Patient denies fever, nausea, vomiting Disposition: [] ED /[] Urgent Care (no appt availability in office) / [] Appointment(In office/virtual)/ []  [place] [facility]/ [] Home Care/ [] Refused Recommended Disposition /[] [place] [facility]/ [x]  Follow-up with PCP Additional Notes: Patient states for the last 3 mornings when he wakes up he is sweating and has a headache. He states throughout the day he feels better. He states today he took 2 Tylenol  which has improved symptoms. Patient seen yesterday in office; concerned that his flu test was a false negative and is asking for further instruction from PCP. Advised to continue home care with Tylenol , ibuprofen, rest and fluids at this time. Please advise.   Summary: Headache/Sweating   Copied From CRM [id number]. Reason for Triage: Patient seen yesterday, tested negative for covid and the flu but his son has the flu. Patient has been waking up with a headache and sweating every morning around 6 am but has no fever     Reason for Disposition  [1] Caller has NON-URGENT question (includes prescribed medication questions) AND [2] triager unable to answer  Answer Assessment - Initial Assessment Questions 1. MAIN CONCERN OR SYMPTOM:  What is your main concern right now? What question do you have? What's the main symptom you're worried about? (e.g., breathing difficulty, cough, fever. pain)     Headache and wakes up sweating. He states it only happens when he wakes up.  2. ONSET: When did the  symptoms  start?     Last couple of days  3. BETTER-SAME-WORSE: Are you getting better, staying the same, or getting worse compared to how you felt at your last visit to the doctor (most recent medical visit)?     Patient states he feels the same as yesterday when he was seen.  4. VISIT DATE: When were you  seen? (Date)     Yesterday.  5. VISIT DOCTOR: What is the name of the doctor taking care of you now?     Alyssa Allwardt PA-C.  6. VISIT DIAGNOSIS:  What was the main symptom or problem that you were seen for? Were you given a diagnosis?      Patient was seen yesterday for his symptoms and had requested a flu test because his son was positive. Patient states he was not given a diagnosis, he was just told he was negative for COVID and flu.  7. VISIT MEDICINES: Did the doctor order any new medicines for you to use? If Yes, ask: Have you filled the prescription and started taking the medicine?      No medications prescribed. He states not really when asked if he was given home care advice for OTC medications to take.  8. NEXT APPOINTMENT: Have you scheduled a follow-up appointment with your doctor?     Denies.  9. PAIN: Is there any pain? If Yes, ask: How bad is it?  (Scale 0-10; or mild, moderate, severe)    - NONE (0): no pain    - MILD (1-3): doesn't interfere with normal activities     - MODERATE (4-7): interferes with normal activities or awakens from sleep     - SEVERE (8-10): excruciating pain, unable to do any normal activities     3-4/10 headache.  10. FEVER: Do you have a fever? If Yes, ask: What is it, how was it measured  and when did it start?  Denies.  11. OTHER SYMPTOMS: Do you have any other symptoms?       Headache.  Protocols used: Recent Medical Visit for Illness Follow-up Call-[facility]

## 2023-07-22 ENCOUNTER — Ambulatory Visit: Payer: Self-pay | Admitting: Physician Assistant

## 2023-07-22 ENCOUNTER — Encounter: Payer: Self-pay | Admitting: Family Medicine

## 2023-07-22 ENCOUNTER — Ambulatory Visit (INDEPENDENT_AMBULATORY_CARE_PROVIDER_SITE_OTHER): Payer: Medicaid Other | Admitting: Family Medicine

## 2023-07-22 VITALS — BP 130/70 | HR 80 | Temp 98.0°F | Resp 18 | Ht 70.0 in | Wt 202.5 lb

## 2023-07-22 DIAGNOSIS — J0110 Acute frontal sinusitis, unspecified: Secondary | ICD-10-CM

## 2023-07-22 LAB — POC COVID19 BINAXNOW: SARS Coronavirus 2 Ag: NEGATIVE

## 2023-07-22 LAB — POCT INFLUENZA A/B
Influenza A, POC: NEGATIVE
Influenza B, POC: NEGATIVE

## 2023-07-22 MED ORDER — AMOXICILLIN-POT CLAVULANATE 875-125 MG PO TABS: 1.0000 | ORAL_TABLET | Freq: Two times a day (BID) | ORAL | 0 refills | Status: DC

## 2023-07-22 MED ORDER — PREDNISONE 20 MG PO TABS: 40.0000 mg | ORAL_TABLET | Freq: Every day | ORAL | 0 refills | Status: AC

## 2023-07-22 NOTE — Progress Notes (Signed)
 Subjective:     Patient ID: Scott Watson, male    DOB: [birth year], 53 y.o.   MRN: [id number]  Chief Complaint  Patient presents with   Cough    Productive cough with green mucus Sx started Monday, tested Tuesday for flu and Covid, neg   Sinus Problem    Sinus congestion that started Monday, using nasal washes Sinus headache Taking Tylenol  and ibuprofen    Nasal Congestion    Started Wed morning     HPI Discussed the use of AI scribe software for clinical note transcription with the patient, who gave verbal consent to proceed.  History of Present Illness   Scott Watson is a 53 year old male who presents with sinus pressure and congestion.  He has been experiencing sinus pressure and congestion for the past five days, accompanied by rhinorrhea and a persistent cough. No fever is present, with his temperature consistently between 97 and 98 degrees Fahrenheit. He describes a pounding sensation in his head every morning around 6:30 to 7:00 AM, which is accompanied by sweating and temporarily subsides after drinking water.  He has a history of sinus infections and notes that his current symptoms are similar to past episodes. The pain is not a typical headache but is related to his sinuses, with pressure in the frontal sinuses and pain that shifts with movement. He experiences a sensation of pressure in his nose and face, denies any tooth pain, but notes slight sensitivity in his ears.  He has been taking ibuprofen and acetaminophen  daily, but these have not alleviated his symptoms. He recalls a previous sinus infection that was successfully treated with antibiotics, specifically amoxicillin , which provided relief.  His son has been diagnosed with the flu and has been out of school for a week. However, he has not experienced fever or the same symptoms as his son.  He has not smoked cigarettes for two weeks, which is a change from his usual habit.       Health Maintenance Due   Topic Date Due   Colonoscopy  Never done    Past Medical History:  Diagnosis Date   Allergy    GERD (gastroesophageal reflux disease)    Hypertension     Past Surgical History:  Procedure Laterality Date   EYE SURGERY     as child   WISDOM TOOTH EXTRACTION       Current Outpatient Medications:    Adapalene -Benzoyl Peroxide  0.1-2.5 % gel, Apply 1 Application topically at bedtime., Disp: 45 g, Rfl: 5   amoxicillin -clavulanate (AUGMENTIN ) 875-125 MG tablet, Take 1 tablet by mouth 2 (two) times daily., Disp: 20 tablet, Rfl: 0   azelastine  (ASTELIN ) 0.1 % nasal spray, Place 1 spray into both nostrils 2 (two) times daily., Disp: 30 mL, Rfl: 5   loratadine-pseudoephedrine (CLARITIN-D 24-HOUR) 10-240 MG 24 hr tablet, Take 1 tablet by mouth daily., Disp: , Rfl:    losartan  (COZAAR ) 50 MG tablet, TAKE 1 TABLET BY MOUTH ONCE DAILY, Disp: 90 tablet, Rfl: 0   predniSONE  (DELTASONE ) 20 MG tablet, Take 2 tablets (40 mg total) by mouth daily with breakfast for 5 days., Disp: 10 tablet, Rfl: 0  No Known Allergies ROS neg/noncontributory except as noted HPI/below      Objective:     BP 130/70   Pulse 80   Temp 98 F (36.7 C) (Temporal)   Resp 18   Ht 5' 10 (1.778 m)   Wt 202 lb 8 oz (91.9 kg)   SpO2  97%   BMI 29.06 kg/m  Wt Readings from Last 3 Encounters:  07/22/23 202 lb 8 oz (91.9 kg)  07/19/23 206 lb 12.8 oz (93.8 kg)  06/23/23 205 lb (93 kg)    Physical Exam   Gen: WDWN NAD HEENT: NCAT, conjunctiva not injected, sclera nonicteric TM WNL B, OP moist, no exudates .  congested NECK:  supple, no thyromegaly, no nodes, CARDIAC: RRR, S1S2+,.  LUNGS: CTAB. No wheezes EXT:  no edema MSK: no gross abnormalities.  NEURO: A&O x3.  CN II-XII intact.  PSYCH: normal mood. Good eye contact  Results for orders placed or performed in visit on 07/22/23  POCT Influenza A/B   Collection Time: 07/22/23  4:51 PM  Result Value Ref Range   Influenza A, POC Negative Negative    Influenza B, POC Negative Negative  POC COVID-19   Collection Time: 07/22/23  4:52 PM  Result Value Ref Range   SARS Coronavirus 2 Ag Negative Negative       Assessment & Plan:  Acute non-recurrent frontal sinusitis -     POC COVID-19 BinaxNow -     POCT Influenza A/B  Other orders -     Amoxicillin -Pot Clavulanate; Take 1 tablet by mouth 2 (two) times daily.  Dispense: 20 tablet; Refill: 0 -     predniSONE ; Take 2 tablets (40 mg total) by mouth daily with breakfast for 5 days.  Dispense: 10 tablet; Refill: 0  Assessment and Plan    Upper Respiratory Infection Presents with a 5-day history of congestion, rhinorrhea, and cough without fever. Symptoms include morning headaches, sweating, and sinus pressure, suggesting a viral etiology. A previous COVID-19 test was negative. Discussed the possibility of a bacterial sinus infection due to similar past experiences. Explained that antibiotics are not typically needed for viral infections, but amoxicillin  is prescribed due to a history of bacterial sinus infections. Considered prednisone  for inflammation reduction. Order a repeat COVID-19 test. Prescribe amoxicillin  and prednisone . Advise continued use of acetaminophen  and ibuprofen for symptom relief. Encourage fluid intake and rest.  Smoking Cessation Reports not smoking for the past two weeks and expresses a desire to quit permanently. Encouraged this decision and provided support. Encourage continued smoking cessation and provide support and resources.  General Health Maintenance Advised to maintain adequate nutrition and hydration, especially given the current illness. Ensure to eat enough and stay hydrated.  Follow-up Call if the COVID-19 test is positive. Monitor symptoms and return if no improvement.        Return if symptoms worsen or fail to improve.  Jenkins CHRISTELLA Carrel, MD

## 2023-07-22 NOTE — Patient Instructions (Addendum)
 It was very nice to see you today!  Antibiotics sent and prednisone .  Tylenol /motrin

## 2023-07-22 NOTE — Telephone Encounter (Signed)
 Noted. Patient scheduled at 4 pm with Dr. Waldo Guitar.

## 2023-07-22 NOTE — Telephone Encounter (Signed)
 Copied from CRM [id number]. Topic: Clinical - Red Word Triage >> Jul 22, 2023  8:27 AM Franky GRADE wrote: Red Word that prompted transfer to Nurse Triage: Patient was seen at the office on 07/19/2023 and tested negative for both the flu and covid but patient states he has been dealing with severe headaches / pressure in the sinus every morning at 7 am since Monday. He is concerned because it keeps happing at the same time every single day and tylenol  doesn't help.   Chief Complaint: sinus congestion/pain Symptoms: sweating Frequency: ongoing since Monday Pertinent Negatives: Patient denies fever Disposition: [] ED /[] Urgent Care (no appt availability in office) / [x] Appointment(In office/virtual)/ []  [facility]/ [] Home Care/ [] Refused Recommended Disposition /[]  Mobile [facility]/ []  Follow-up with PCP Additional Notes: The patient reported that he recently tested negative for the flu and covid but his son who lives with him , tested positive for the flu.  Since Monday, the patient has woken up with sinus congestion/ discomfort 4-5/10 and sweating every morning.  He has been doing nasal washes and spray but his symptoms persist.  He was scheduled for a same day appointment for follow up.    Reason for Disposition  [1] Using nasal washes and pain medicine > 24 hours AND [2] sinus pain (around cheekbone or eye) persists  Answer Assessment - Initial Assessment Questions 1. LOCATION: Where does it hurt?      Around eyes  2. ONSET: When did the sinus pain start?  (e.g., hours, days)      Monday  3. SEVERITY: How bad is the pain?   (Scale 1-10; mild, moderate or severe)   - MILD (1-3): doesn't interfere with normal activities    - MODERATE (4-7): interferes with normal activities (e.g., work or school) or awakens from sleep   - SEVERE (8-10): excruciating pain and patient unable to do any normal activities        4-5/10 4. RECURRENT SYMPTOM: Have you ever had sinus problems  before? If Yes, ask: When was the last time? and What happened that time?      No 5. NASAL CONGESTION: Is the nose blocked? If Yes, ask: Can you open it or must you breathe through your mouth?     Yes  6. NASAL DISCHARGE: Do you have discharge from your nose? If so ask, What color?     Nasal discharge clear/yellowish 7. FEVER: Do you have a fever? If Yes, ask: What is it, how was it measured, and when did it start?      No fever  8. OTHER SYMPTOMS: Do you have any other symptoms? (e.g., sore throat, cough, earache, difficulty breathing)     Sweating, throat slightly sore  Protocols used: Sinus Pain or Congestion-[facility]

## 2023-08-13 DIAGNOSIS — Z419 Encounter for procedure for purposes other than remedying health state, unspecified: Secondary | ICD-10-CM | POA: Diagnosis not present

## 2023-09-24 DIAGNOSIS — Z419 Encounter for procedure for purposes other than remedying health state, unspecified: Secondary | ICD-10-CM | POA: Diagnosis not present

## 2023-09-28 ENCOUNTER — Telehealth: Payer: Self-pay

## 2023-09-28 NOTE — Telephone Encounter (Signed)
 Copied from CRM [id number]. Topic: Clinical - Medication Question >> Sep 28, 2023  1:40 PM Scott Watson wrote: Reason for CRM: pt called to speak with provider ,states he needs to asks some medication question, please call pt back at [phone]  Pt stating someone he has been in close contact with reached out to him and advised he needs to be tested due to her having mycoplasma infection. Advised patient PCP next available appt and wanting to schedule to discuss with PCP. No symptoms or signs of infection per patient. Pt appt scheduled as requested.

## 2023-10-03 NOTE — Telephone Encounter (Signed)
 Noted and agreed, thank you.

## 2023-10-04 DIAGNOSIS — Z0120 Encounter for dental examination and cleaning without abnormal findings: Secondary | ICD-10-CM | POA: Diagnosis not present

## 2023-10-06 ENCOUNTER — Encounter: Payer: Self-pay | Admitting: Physician Assistant

## 2023-10-06 ENCOUNTER — Ambulatory Visit (INDEPENDENT_AMBULATORY_CARE_PROVIDER_SITE_OTHER): Admitting: Physician Assistant

## 2023-10-06 ENCOUNTER — Ambulatory Visit

## 2023-10-06 VITALS — BP 134/80 | HR 81 | Temp 97.5°F | Ht 70.0 in | Wt 210.0 lb

## 2023-10-06 DIAGNOSIS — F17200 Nicotine dependence, unspecified, uncomplicated: Secondary | ICD-10-CM

## 2023-10-06 DIAGNOSIS — Z20818 Contact with and (suspected) exposure to other bacterial communicable diseases: Secondary | ICD-10-CM

## 2023-10-06 DIAGNOSIS — Z1211 Encounter for screening for malignant neoplasm of colon: Secondary | ICD-10-CM | POA: Diagnosis not present

## 2023-10-06 DIAGNOSIS — A493 Mycoplasma infection, unspecified site: Secondary | ICD-10-CM | POA: Diagnosis not present

## 2023-10-06 DIAGNOSIS — Z87891 Personal history of nicotine dependence: Secondary | ICD-10-CM | POA: Diagnosis not present

## 2023-10-06 NOTE — Progress Notes (Signed)
 Patient ID: Scott Watson, male    DOB: 19-Apr-1971, 52 y.o.   MRN: [id number]   Assessment & Plan:  Screening for colon cancer -     Ambulatory referral to Gastroenterology      Assessment and Plan Assessment & Plan Exposure to Mycoplasma pneumoniae Recent exposure to Mycoplasma pneumoniae through contact with an individual from [place]  with respiratory symptoms. No current respiratory or urinary symptoms. Concern for potential asymptomatic infection or carriage. Differential diagnosis includes respiratory infection or asymptomatic carriage. - Order chest x-ray to rule out silent walking pneumonia - Order urine test for gonorrhea, chlamydia, trichomonas, and mycoplasma - Advise safe sex practices - Follow up with results and treat as necessary  Tobacco use Currently smokes black and white cigars, having reduced from a previous habit of smoking a pack of cigarettes a day. Smoking cessation advised due to potential respiratory risks and overall health benefits. - Advise smoking cessation   Subjective:    Chief Complaint  Patient presents with   bacterial infection    Pt wanting to discuss possible bacterial infection; Pt stating someone he has been in close contact with reached out to him and advised he needs to be tested due to her having mycoplasma infection. Pt declines any symptoms or URI symptoms; order placed for colon cancer screening with pt permission.     HPI Discussed the use of AI scribe software for clinical note transcription with the patient, who gave verbal consent to proceed.  History of Present Illness Scott Watson is a 53 year old male who presents with concerns about exposure to mycoplasma pneumonia.  He had contact with an individual diagnosed with mycoplasma pneumonia approximately three weeks ago. He is currently asymptomatic with no respiratory symptoms such as cough, congestion, shortness of breath, or chest pain. No urinary symptoms or  discharge.  He has a history of a sinus infection in February, which coincided with his son having the flu. Since then, he has been feeling well.  He has a history of smoking, previously smoking a pack of cigarettes a day, but has since quit and now smokes black and mild cigars. 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  Family members in nursing positions have informed him about the transmission of mycoplasma through contact, emphasizing the importance of being cautious.     Past Medical History:  Diagnosis Date   Allergy    GERD (gastroesophageal reflux disease)    Hypertension     Past Surgical History:  Procedure Laterality Date   EYE SURGERY     as child   WISDOM TOOTH EXTRACTION      Family History  Problem Relation Age of Onset   Diabetes Mother    Hypertension Mother    Heart disease Father    Hypertension Father    Stroke Father    Colon cancer Neg Hx    Colon polyps Neg Hx    Esophageal cancer Neg Hx    Rectal cancer Neg Hx    Stomach cancer Neg Hx     Social History   Tobacco Use   Smoking status: Every Day    Current packs/day: 0.50    Average packs/day: 0.5 packs/day for 15.0 years (7.5 ttl pk-yrs)    Types: Cigarettes   Smokeless tobacco: Never  Vaping Use   Vaping status: Never Used  Substance Use Topics   Alcohol use: Not Currently   Drug  use: No     No Known Allergies  Review of Systems NEGATIVE UNLESS OTHERWISE INDICATED IN HPI      Objective:     BP 134/80 (BP Location: Left Arm, Patient Position: Sitting, Cuff Size: Normal)   Pulse 81   Temp (!) 97.5 F (36.4 C) (Temporal)   Ht 5\' 10"  (1.778 m)   Wt 210 lb (95.3 kg)   SpO2 99%   BMI 30.13 kg/m   Wt Readings from Last 3 Encounters:  10/06/23 210 lb (95.3 kg)  07/22/23 202 lb 8 oz (91.9 kg)  07/19/23 206 lb 12.8 oz (93.8 kg)    BP Readings from Last 3 Encounters:  10/06/23  134/80  07/22/23 130/70  07/19/23 120/78     Physical Exam Vitals and nursing note reviewed.  Constitutional:      General: He is not in acute distress.    Appearance: Normal appearance. He is not ill-appearing.  HENT:     Head: Normocephalic.     Right Ear: External ear normal.     Left Ear: External ear normal.     Nose: No congestion.     Mouth/Throat:     Mouth: Mucous membranes are moist.     Pharynx: No oropharyngeal exudate or posterior oropharyngeal erythema.  Eyes:     Extraocular Movements: Extraocular movements intact.     Conjunctiva/sclera: Conjunctivae normal.     Pupils: Pupils are equal, round, and reactive to light.  Cardiovascular:     Rate and Rhythm: Normal rate and regular rhythm.     Pulses: Normal pulses.     Heart sounds: Normal heart sounds. No murmur heard. Pulmonary:     Effort: Pulmonary effort is normal. No respiratory distress.     Breath sounds: Normal breath sounds. No wheezing.  Musculoskeletal:     Cervical back: Normal range of motion.  Skin:    General: Skin is warm.  Neurological:     Mental Status: He is alert and oriented to person, place, and time.  Psychiatric:        Mood and Affect: Mood normal.        Behavior: Behavior normal.             Analis Distler M Liset Mcmonigle, PA-C

## 2023-10-10 ENCOUNTER — Other Ambulatory Visit: Payer: Self-pay | Admitting: Physician Assistant

## 2023-10-10 ENCOUNTER — Encounter: Payer: Self-pay | Admitting: Physician Assistant

## 2023-10-10 LAB — CT/GC/TV NAA+MYCOPLASMAS URINE
Chlamydia trachomatis, NAA: NEGATIVE
Mycoplasma genitalium NAA: NEGATIVE
Neisseria gonorrhoeae, NAA: NEGATIVE
Trich vag by NAA: NEGATIVE

## 2023-10-10 MED ORDER — DOXYCYCLINE HYCLATE 100 MG PO TABS: 100.0000 mg | ORAL_TABLET | Freq: Two times a day (BID) | ORAL | 0 refills | Status: AC

## 2023-10-13 ENCOUNTER — Encounter: Payer: Self-pay | Admitting: Physician Assistant

## 2023-10-24 DIAGNOSIS — Z419 Encounter for procedure for purposes other than remedying health state, unspecified: Secondary | ICD-10-CM | POA: Diagnosis not present

## 2023-11-08 ENCOUNTER — Other Ambulatory Visit: Payer: Self-pay | Admitting: Physician Assistant

## 2023-11-16 DIAGNOSIS — K0510 Chronic gingivitis, plaque induced: Secondary | ICD-10-CM | POA: Diagnosis not present

## 2023-11-16 DIAGNOSIS — K029 Dental caries, unspecified: Secondary | ICD-10-CM | POA: Diagnosis not present

## 2023-11-24 DIAGNOSIS — Z419 Encounter for procedure for purposes other than remedying health state, unspecified: Secondary | ICD-10-CM | POA: Diagnosis not present

## 2023-12-24 DIAGNOSIS — Z419 Encounter for procedure for purposes other than remedying health state, unspecified: Secondary | ICD-10-CM | POA: Diagnosis not present

## 2024-01-17 DIAGNOSIS — K029 Dental caries, unspecified: Secondary | ICD-10-CM | POA: Diagnosis not present

## 2024-01-23 ENCOUNTER — Other Ambulatory Visit: Payer: Self-pay | Admitting: Physician Assistant

## 2024-01-24 DIAGNOSIS — Z419 Encounter for procedure for purposes other than remedying health state, unspecified: Secondary | ICD-10-CM | POA: Diagnosis not present

## 2024-02-24 DIAGNOSIS — Z419 Encounter for procedure for purposes other than remedying health state, unspecified: Secondary | ICD-10-CM | POA: Diagnosis not present

## 2024-03-25 DIAGNOSIS — Z419 Encounter for procedure for purposes other than remedying health state, unspecified: Secondary | ICD-10-CM | POA: Diagnosis not present

## 2024-04-30 ENCOUNTER — Encounter: Payer: Self-pay | Admitting: Physician Assistant

## 2024-04-30 ENCOUNTER — Ambulatory Visit (INDEPENDENT_AMBULATORY_CARE_PROVIDER_SITE_OTHER): Admitting: Physician Assistant

## 2024-04-30 VITALS — BP 170/92 | HR 76 | Temp 98.3°F | Ht 68.9 in | Wt 209.2 lb

## 2024-04-30 DIAGNOSIS — Z125 Encounter for screening for malignant neoplasm of prostate: Secondary | ICD-10-CM

## 2024-04-30 DIAGNOSIS — I10 Essential (primary) hypertension: Secondary | ICD-10-CM

## 2024-04-30 DIAGNOSIS — L700 Acne vulgaris: Secondary | ICD-10-CM | POA: Diagnosis not present

## 2024-04-30 DIAGNOSIS — Z114 Encounter for screening for human immunodeficiency virus [HIV]: Secondary | ICD-10-CM | POA: Diagnosis not present

## 2024-04-30 DIAGNOSIS — Z1159 Encounter for screening for other viral diseases: Secondary | ICD-10-CM

## 2024-04-30 DIAGNOSIS — Z0000 Encounter for general adult medical examination without abnormal findings: Secondary | ICD-10-CM | POA: Diagnosis not present

## 2024-04-30 DIAGNOSIS — Z1211 Encounter for screening for malignant neoplasm of colon: Secondary | ICD-10-CM | POA: Diagnosis not present

## 2024-04-30 DIAGNOSIS — Z13220 Encounter for screening for lipoid disorders: Secondary | ICD-10-CM | POA: Diagnosis not present

## 2024-04-30 DIAGNOSIS — Z23 Encounter for immunization: Secondary | ICD-10-CM | POA: Diagnosis not present

## 2024-04-30 MED ORDER — LOSARTAN POTASSIUM 100 MG PO TABS: 100.0000 mg | ORAL_TABLET | Freq: Every day | ORAL | 0 refills | Status: DC

## 2024-04-30 MED ORDER — ADAPALENE-BENZOYL PEROXIDE 0.1-2.5 % EX GEL: 1.0000 | Freq: Every day | CUTANEOUS | 5 refills | Status: AC

## 2024-04-30 NOTE — Patient Instructions (Addendum)
 Please call [facility] to schedule your colonoscopy. [phone]  Call [facility] or go online to schedule for an eye appointment    VISIT SUMMARY: Today, you had your annual physical exam. Your blood pressure was elevated, and we discussed your current medications and lifestyle. We also talked about your vision, diet, exercise, and the need for some screenings and tests.  YOUR PLAN: ADULT WELLNESS VISIT: Annual physical exam conducted. Blood pressure elevated at 178/88 mmHg. No significant changes in bowel habits or urinary symptoms. No alcohol use, occasional smoking of Black & Milds. Engages in regular exercise and maintains a healthy diet. Vision and dental care discussed. -Ordered CBC, CMP, lipid panel, TSH, HIV, hep C, and PSA tests -Encouraged regular exercise and healthy diet -Advised to find an eye doctor for vision check  ESSENTIAL HYPERTENSION: Blood pressure elevated at 178/88 mmHg, with home readings of 143/86 mmHg. Currently on losartan  50 mg daily. No significant symptoms such as headaches or chest pain, though occasional slight headaches upon waking. Possible contributing factors include recent illness and medication use. Discussed potential need for medication adjustment if blood pressure remains elevated. -Increased losartan  to 100 mg daily -Scheduled follow-up in 4 weeks to reassess blood pressure -Instructed to keep a log of blood pressure readings for next appointment -Will consider adding amlodipine if blood pressure remains elevated  ACNE VULGARIS: Continues to use benzoyl peroxide  for adult acne management. -Continue benzoyl peroxide  for acne management  SCREENING FOR COLON CANCER: Due for colon cancer screening. No prior screening history. No bowel changes or blood in stool reported. -Referred for colon cancer screening  SCREENING FOR HIV AND OTHER VIRAL DISEASES: Due for HIV and hepatitis C screening. -Ordered HIV and hepatitis C screening  IMMUNIZATION:  Received flu shot during today's visit. -Received flu shot                      Contains text generated by [employer].                                 Contains text generated by [employer].

## 2024-04-30 NOTE — Progress Notes (Signed)
 Patient ID: Scott Watson, male    DOB: 1970-08-29, 53 y.o.   MRN: [id number]   Assessment & Plan:  Annual physical exam -     CBC with Differential/Platelet -     Comprehensive metabolic panel with GFR -     Lipid panel -     TSH -     PSA  Essential hypertension  Acne vulgaris -     Adapalene -Benzoyl Peroxide ; Apply 1 Application topically at bedtime.  Dispense: 45 g; Refill: 5  Need for hepatitis C screening test -     Hepatitis C antibody  Screening for HIV (human immunodeficiency virus) -     HIV Antibody (routine testing w rflx)  Screening for colon cancer -     Ambulatory referral to Gastroenterology  Immunization due -     Flu vaccine trivalent PF, 6mos and older(Flulaval,Afluria,Fluarix,Fluzone)  Other orders -     Losartan  Potassium; Take 1 tablet (100 mg total) by mouth daily.  Dispense: 30 tablet; Refill: 0      Assessment and Plan Assessment & Plan Adult Wellness Visit Annual physical exam conducted. Blood pressure elevated at 178/88 mmHg. No significant changes in bowel habits or urinary symptoms. No alcohol use, occasional smoking of Black & Milds. Engages in regular exercise and maintains a healthy diet. Vision and dental care discussed. - Ordered CBC, CMP, lipid panel, TSH, HIV, hep C, and PSA tests - Encouraged regular exercise and healthy diet - Advised to find an eye doctor for vision check  Essential hypertension Blood pressure elevated at 178/88 mmHg, with home readings of 143/86 mmHg. Currently on losartan  50 mg daily. No significant symptoms such as headaches or chest pain, though occasional slight headaches upon waking. Possible contributing factors include recent illness and medication use. Discussed potential need for medication adjustment if blood pressure remains elevated. - Increased losartan  to 100 mg daily - Scheduled follow-up in 4 weeks to reassess blood pressure - Instructed to keep a log of blood pressure readings for next  appointment - Will consider adding amlodipine if blood pressure remains elevated  Acne vulgaris Continues to use benzoyl peroxide  for adult acne management. - Continue benzoyl peroxide  for acne management  Screening for colon cancer Due for colon cancer screening. No prior screening history. No bowel changes or blood in stool reported. - Referred for colon cancer screening  Screening for HIV and other viral diseases Due for HIV and hepatitis C screening. - Ordered HIV and hepatitis C screening  Immunization Received flu shot during today's visit.  Age-appropriate screening and counseling performed today. Will check labs and call with results. Preventive measures discussed and printed in AVS for patient.   Patient Counseling: [x]   Nutrition: Stressed importance of moderation in sodium/caffeine intake, saturated fat and cholesterol, caloric balance, sufficient intake of fresh fruits, vegetables, and fiber.  [x]   Stressed the importance of regular exercise.   [x]   Substance Abuse: Discussed cessation/primary prevention of tobacco, alcohol, or other drug use; driving or other dangerous activities under the influence; availability of treatment for abuse.   []   Injury prevention: Discussed safety belts, safety helmets, smoke detector, smoking near bedding or upholstery.   []   Sexuality: Discussed sexually transmitted diseases, partner selection, use of condoms, avoidance of unintended pregnancy  and contraceptive alternatives.   [x]   Dental health: Discussed importance of regular tooth brushing, flossing, and dental visits.  [x]   Health maintenance and immunizations reviewed. Please refer to Health maintenance section.  Return in about 4 weeks (around 05/28/2024) for recheck/follow-up, blood pressure check.    Subjective:    Chief Complaint  Patient presents with   Annual Exam    Pt in office for annual CPE;     HPI Discussed the use of AI scribe software for clinical  note transcription with the patient, who gave verbal consent to proceed.  History of Present Illness Scott Watson is a 53 year old male who presents for his annual physical exam.  He reports elevated blood pressure readings today, with a measurement of 178/88 mmHg. He is currently taking losartan  50 mg daily for hypertension. His home blood pressure readings were 143/86 mmHg, which he notes is 'trending kind of high.' No chest pain, but he experiences occasional slight headaches upon waking, which he attributes to his sleeping position. He considers that his recent illness and use of Theraflu might have affected his blood pressure.  He has not yet undergone a colon cancer screening and has no history of bowel changes or blood in the stool. He denies any urinary changes such as frequency, burning, or nocturia.  He has quit smoking cigarettes but occasionally smokes Black & Mild cigars. He does not consume alcohol regularly, only occasionally at social functions. He mentions starting to exercise again, including walking and drinking water. He also discusses using Seamoss, which he purchases from an organic store.  He continues to use benzoyl peroxide  for adult acne. He has not found an eye doctor yet but plans to get his vision checked. He reports occasional blurriness in one eye, which he attributes to a childhood injury.  He has an 67-year-old son and a 10 year old son who lives nearby. He describes his diet as including eggs, grits, oatmeal, [employer], and salads, and he avoids salt and pork, and does not consume alcohol regularly, only occasionally at social functions.     Past Medical History:  Diagnosis Date   Allergy    GERD (gastroesophageal reflux disease)    Hypertension     Past Surgical History:  Procedure Laterality Date   EYE SURGERY     as child   WISDOM TOOTH EXTRACTION      Family History  Problem Relation Age of Onset   Diabetes Mother    Hypertension Mother     Heart disease Father    Hypertension Father    Stroke Father    Colon cancer Neg Hx    Colon polyps Neg Hx    Esophageal cancer Neg Hx    Rectal cancer Neg Hx    Stomach cancer Neg Hx     Social History   Tobacco Use   Smoking status: Every Day    Current packs/day: 0.50    Average packs/day: 0.5 packs/day for 15.0 years (7.5 ttl pk-yrs)    Types: Cigarettes   Smokeless tobacco: Never  Vaping Use   Vaping status: Never Used  Substance Use Topics   Alcohol use: Not Currently   Drug use: Never     No Known Allergies  Review of Systems NEGATIVE UNLESS OTHERWISE INDICATED IN HPI      Objective:     BP (!) 170/92 (BP Location: Right Arm, Patient Position: Sitting, Cuff Size: Large)   Pulse 76   Temp 98.3 F (36.8 C) (Temporal)   Ht 5' 8.9 (1.75 m)   Wt 209 lb 3.2 oz (94.9 kg)   SpO2 100%   BMI 30.98 kg/m   Wt Readings from Last 3 Encounters:  04/30/24 209  lb 3.2 oz (94.9 kg)  10/06/23 210 lb (95.3 kg)  07/22/23 202 lb 8 oz (91.9 kg)    BP Readings from Last 3 Encounters:  04/30/24 (!) 170/92  10/06/23 134/80  07/22/23 130/70     Physical Exam Vitals and nursing note reviewed.  Constitutional:      General: He is not in acute distress.    Appearance: Normal appearance. He is obese. He is not toxic-appearing.  HENT:     Head: Normocephalic and atraumatic.     Right Ear: Tympanic membrane, ear canal and external ear normal.     Left Ear: Tympanic membrane, ear canal and external ear normal.     Nose: Nose normal.     Mouth/Throat:     Mouth: Mucous membranes are moist.     Pharynx: Oropharynx is clear.  Eyes:     Extraocular Movements: Extraocular movements intact.     Conjunctiva/sclera: Conjunctivae normal.     Pupils: Pupils are equal, round, and reactive to light.  Cardiovascular:     Rate and Rhythm: Normal rate and regular rhythm.     Pulses: Normal pulses.     Heart sounds: Normal heart sounds.  Pulmonary:     Effort: Pulmonary effort is  normal.     Breath sounds: Normal breath sounds.  Abdominal:     General: Abdomen is flat. Bowel sounds are normal.     Palpations: Abdomen is soft.     Tenderness: There is no abdominal tenderness.  Musculoskeletal:        General: Normal range of motion.     Cervical back: Normal range of motion and neck supple.     Right lower leg: No edema.     Left lower leg: No edema.  Skin:    General: Skin is warm and dry.     Findings: No rash.  Neurological:     General: No focal deficit present.     Mental Status: He is alert and oriented to person, place, and time.  Psychiatric:        Mood and Affect: Mood normal.        Behavior: Behavior normal.             Scott Watson M Scott Geister, PA-C

## 2024-05-01 LAB — CBC WITH DIFFERENTIAL/PLATELET
Basophils Absolute: 0 K/uL (ref 0.0–0.1)
Basophils Relative: 0.7 % (ref 0.0–3.0)
Eosinophils Absolute: 0.1 K/uL (ref 0.0–0.7)
Eosinophils Relative: 1.7 % (ref 0.0–5.0)
HCT: 45.5 % (ref 39.0–52.0)
Hemoglobin: 15.2 g/dL (ref 13.0–17.0)
Lymphocytes Relative: 20.1 % (ref 12.0–46.0)
Lymphs Abs: 1.3 K/uL (ref 0.7–4.0)
MCHC: 33.4 g/dL (ref 30.0–36.0)
MCV: 86.9 fl (ref 78.0–100.0)
Monocytes Absolute: 0.5 K/uL (ref 0.1–1.0)
Monocytes Relative: 7.4 % (ref 3.0–12.0)
Neutro Abs: 4.4 K/uL (ref 1.4–7.7)
Neutrophils Relative %: 70.1 % (ref 43.0–77.0)
Platelets: 278 K/uL (ref 150.0–400.0)
RBC: 5.23 Mil/uL (ref 4.22–5.81)
RDW: 14.4 % (ref 11.5–15.5)
WBC: 6.3 K/uL (ref 4.0–10.5)

## 2024-05-01 LAB — TSH: TSH: 2.06 u[IU]/mL (ref 0.35–5.50)

## 2024-05-01 LAB — COMPREHENSIVE METABOLIC PANEL WITH GFR
ALT: 16 U/L (ref 0–53)
AST: 15 U/L (ref 0–37)
Albumin: 4.6 g/dL (ref 3.5–5.2)
Alkaline Phosphatase: 84 U/L (ref 39–117)
BUN: 9 mg/dL (ref 6–23)
CO2: 27 meq/L (ref 19–32)
Calcium: 9.4 mg/dL (ref 8.4–10.5)
Chloride: 106 meq/L (ref 96–112)
Creatinine, Ser: 1.09 mg/dL (ref 0.40–1.50)
GFR: 78.1 mL/min (ref >60.00)
Glucose, Bld: 92 mg/dL (ref 70–99)
Potassium: 4 meq/L (ref 3.5–5.1)
Sodium: 143 meq/L (ref 135–145)
Total Bilirubin: 0.7 mg/dL (ref 0.2–1.2)
Total Protein: 7 g/dL (ref 6.0–8.3)

## 2024-05-01 LAB — PSA: PSA: 0.75 ng/mL (ref 0.10–4.00)

## 2024-05-01 LAB — LIPID PANEL
Cholesterol: 162 mg/dL (ref 0–200)
HDL: 35.3 mg/dL — ABNORMAL LOW (ref >39.00)
LDL Cholesterol: 98 mg/dL (ref 0–99)
NonHDL: 126.29
Total CHOL/HDL Ratio: 5
Triglycerides: 140 mg/dL (ref 0.0–149.0)
VLDL: 28 mg/dL (ref 0.0–40.0)

## 2024-05-01 LAB — HIV ANTIBODY (ROUTINE TESTING W REFLEX)
HIV 1&2 Ab, 4th Generation: NONREACTIVE
HIV FINAL INTERPRETATION: NEGATIVE

## 2024-05-01 LAB — HEPATITIS C ANTIBODY: Hepatitis C Ab: NONREACTIVE

## 2024-05-03 ENCOUNTER — Ambulatory Visit: Payer: Self-pay | Admitting: Physician Assistant

## 2024-05-29 ENCOUNTER — Ambulatory Visit: Admitting: Physician Assistant

## 2024-06-03 ENCOUNTER — Other Ambulatory Visit: Payer: Self-pay | Admitting: Physician Assistant

## 2024-07-07 ENCOUNTER — Other Ambulatory Visit: Payer: Self-pay | Admitting: Physician Assistant

## 2024-07-12 ENCOUNTER — Encounter: Payer: Self-pay | Admitting: Gastroenterology

## 2024-08-01 ENCOUNTER — Encounter

## 2024-08-15 ENCOUNTER — Encounter: Admitting: Gastroenterology
